# Patient Record
Sex: Male | Born: 1985 | Race: White | Hispanic: No | Marital: Married
Health system: Midwestern US, Community
[De-identification: ages and names within clinical notes are randomized; demographics above are authoritative.]

## PROBLEM LIST (undated history)

## (undated) DIAGNOSIS — B019 Varicella without complication: Secondary | ICD-10-CM

## (undated) DIAGNOSIS — Z0000 Encounter for general adult medical examination without abnormal findings: Secondary | ICD-10-CM

## (undated) DIAGNOSIS — M94262 Chondromalacia, left knee: Secondary | ICD-10-CM

## (undated) DIAGNOSIS — M25561 Pain in right knee: Secondary | ICD-10-CM

## (undated) DIAGNOSIS — Z20822 Contact with and (suspected) exposure to covid-19: Secondary | ICD-10-CM

## (undated) DIAGNOSIS — E559 Vitamin D deficiency, unspecified: Secondary | ICD-10-CM

## (undated) DIAGNOSIS — M25562 Pain in left knee: Secondary | ICD-10-CM

## (undated) HISTORY — DX: Varicella without complication: B01.9

## (undated) HISTORY — PX: KNEE ARTHROSCOPY: SHX127

## (undated) HISTORY — PX: WISDOM TOOTH EXTRACTION: SHX21

---

## 2011-01-24 LAB — CBC WITH AUTO DIFFERENTIAL
Absolute Eos #: 0.14 E9/L (ref 0.05–0.50)
Absolute Lymph #: 2.67 E9/L (ref 1.50–4.00)
Absolute Mono #: 0.7 E9/L (ref 0.10–0.95)
Absolute Neut #: 4.04 E9/L (ref 1.80–7.30)
Basophils Absolute: 0.05 E9/L (ref 0.00–0.20)
Basophils: 1 % (ref 0–2)
Eosinophils %: 2 % (ref 0–6)
Hematocrit: 45.8 % (ref 37.0–54.0)
Hemoglobin: 15.6 g/dL (ref 12.5–16.5)
Lymphocytes: 35 % (ref 20–42)
MCH: 31.5 pg (ref 26.0–35.0)
MCHC: 34.1 % (ref 32.0–34.5)
MCV: 92.2 fL (ref 80.0–99.9)
MPV: 9 fL (ref 7.0–12.0)
Monocytes: 9 % (ref 2–12)
Platelets: 205 E9/L (ref 130–450)
RBC: 4.97 E12/L (ref 3.80–5.80)
RDW: 12.3 fL (ref 11.5–15.0)
Seg Neutrophils: 53 % (ref 43–80)
WBC: 7.6 E9/L (ref 4.5–11.5)

## 2013-03-30 HISTORY — PX: LYMPH NODE BIOPSY: SHX201

## 2013-04-18 ENCOUNTER — Emergency Department: Payer: Self-pay | Admitting: Emergency Medicine

## 2013-04-18 LAB — CBC WITH DIFFERENTIAL/PLATELET
Eosinophil %: 1.4 %
HCT: 45.8 % (ref 40.0–52.0)
HGB: 15.6 g/dL (ref 13.0–18.0)
Lymphocyte #: 2 10*3/uL (ref 1.0–3.6)
MCH: 29.7 pg (ref 26.0–34.0)
MCHC: 34.1 g/dL (ref 32.0–36.0)
Monocyte #: 1.1 x10 3/mm — ABNORMAL HIGH (ref 0.2–1.0)
Neutrophil %: 52.6 %
Platelet: 171 10*3/uL (ref 150–440)
RBC: 5.27 10*6/uL (ref 4.40–5.90)
RDW: 12.3 % (ref 11.5–14.5)
WBC: 6.8 10*3/uL (ref 3.8–10.6)

## 2013-04-18 LAB — RAPID INFLUENZA A&B ANTIGENS

## 2013-04-18 LAB — BASIC METABOLIC PANEL
BUN: 18 mg/dL (ref 7–18)
Chloride: 103 mmol/L (ref 98–107)
Creatinine: 1.17 mg/dL (ref 0.60–1.30)
EGFR (African American): >60
Glucose: 90 mg/dL (ref 65–99)
Osmolality: 275 (ref 275–301)
Potassium: 4.6 mmol/L (ref 3.5–5.1)
Sodium: 137 mmol/L (ref 136–145)

## 2013-05-19 ENCOUNTER — Ambulatory Visit: Payer: Self-pay | Admitting: Otolaryngology

## 2013-05-28 ENCOUNTER — Ambulatory Visit: Payer: Self-pay | Admitting: Otolaryngology

## 2013-06-04 ENCOUNTER — Ambulatory Visit: Payer: Self-pay | Admitting: Otolaryngology

## 2013-06-08 LAB — PATHOLOGY REPORT

## 2013-08-05 ENCOUNTER — Encounter: Payer: Self-pay | Admitting: Adult Health

## 2013-08-05 ENCOUNTER — Encounter (INDEPENDENT_AMBULATORY_CARE_PROVIDER_SITE_OTHER): Payer: Self-pay

## 2013-08-05 ENCOUNTER — Ambulatory Visit (INDEPENDENT_AMBULATORY_CARE_PROVIDER_SITE_OTHER): Payer: BC Managed Care – PPO | Admitting: Adult Health

## 2013-08-05 VITALS — BP 112/78 | HR 72 | Temp 98.3°F | Resp 14 | Ht 68.6 in | Wt 208.0 lb

## 2013-08-05 DIAGNOSIS — Z0000 Encounter for general adult medical examination without abnormal findings: Secondary | ICD-10-CM

## 2013-08-05 DIAGNOSIS — R599 Enlarged lymph nodes, unspecified: Secondary | ICD-10-CM

## 2013-08-05 DIAGNOSIS — R590 Localized enlarged lymph nodes: Secondary | ICD-10-CM

## 2013-08-05 NOTE — Progress Notes (Signed)
Pre visit review using our clinic review tool, if applicable. No additional management support is needed unless otherwise documented below in the visit note. 

## 2013-08-05 NOTE — Progress Notes (Signed)
Patient ID: Kristopher Hebert, male   DOB: July 02, 1985, 28 y.o.   MRN: [id number]    Subjective:    Patient ID: Kristopher Hebert, male    DOB: 05-04-1985, 28 y.o.   MRN: [id number]  HPI  It is a pleasant 28 year old male who presents to clinic to establish care. He moved to this area approximately one year ago from [place]. 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 He reports that in December 2014, he had a lymph node biopsy of the cervical region. Patient reports that surgeon was ruling out lymphoma. Biopsy was negative for malignancy. He still wonders why he had lymphadenopathy. The surgeon had discussed that possibilities of toxoplasmosis versus mononucleosis versus cat scratch fever. However, patient reports that he was never tested for any of these. He would like to have lab work to determine if any antibodies. Still has a small palpable lymph node below the jaw line on the left side.    Past Medical History  Diagnosis Date  . Chicken pox      Past Surgical History  Procedure Laterality Date  . Knee arthroscopy Right     before 2004  . Wisdom tooth extraction      before 2004  . Lymph node biopsy Left 03/2013    Cervical, parotid     Family History  Problem Relation Age of Onset  . Hyperlipidemia Mother   . Hypertension Mother   . Cancer Maternal Grandfather     prostate cancer     History   Social History  . Marital Status: Married    Spouse Name: N/A    Number of Children: 0  . Years of Education: 35   Occupational History  . Nurse Anesthesist      [employer]   Social History Main Topics  . Smoking status: Never Smoker   . Smokeless tobacco: Not on file  . Alcohol Use: Yes     Comment: few drinks a week   . Drug Use: No  . Sexual Activity: Yes   Other Topics Concern  . Not on file   Social History Narrative   Kristopher Hebert grew up in [place]. He attended [employer] for his Bachelor's in Nursing. He then attended the same university and obtained his Masters in  Nursing, Garment/textile technologist. He is currently living in [place] with his wife. He enjoys working out. Spends approximately 1.5 hours at the gym, 6 days a week.      Caffeine - 1-2 cups per day   Exercise - 6 days per week   Diet - lean protein, fruits, vegetables, mindful of carbs    Review of Systems  Constitutional: Negative.   HENT:       Pt still feels slightly swollen lymph node below jaw on the left side.  Eyes: Negative.   Respiratory: Negative.   Cardiovascular: Negative.   Gastrointestinal: Negative.   Endocrine: Negative.   Genitourinary: Negative.   Musculoskeletal: Negative.   Skin: Negative.   Allergic/Immunologic: Negative.   Neurological: Negative.   Hematological: Negative.   Psychiatric/Behavioral: Negative.        Objective:  There were no vitals taken for this visit.   Physical Exam  Constitutional: He is oriented to person, place, and time. He appears well-developed and well-nourished. No distress.  HENT:  Head: Normocephalic and atraumatic.  Right Ear: External ear normal.  Left Ear: External ear normal.  Nose: Nose normal.  Mouth/Throat: Oropharynx is clear and moist.  Eyes: Conjunctivae and EOM  are normal. Pupils are equal, round, and reactive to light.  Neck: Normal range of motion. Neck supple. No tracheal deviation present. No thyromegaly present.  Cardiovascular: Normal rate, regular rhythm, normal heart sounds and intact distal pulses.  Exam reveals no gallop and no friction rub.   No murmur heard. Pulmonary/Chest: Effort normal and breath sounds normal. No respiratory distress. He has no wheezes. He has no rales.  Abdominal: Soft. Bowel sounds are normal. He exhibits no distension and no mass. There is no tenderness. There is no rebound and no guarding.  Musculoskeletal: Normal range of motion. He exhibits no edema and no tenderness.  Lymphadenopathy:    He has no cervical adenopathy.  Neurological: He is alert and oriented to person, place, and  time. He has normal reflexes. No cranial nerve deficit. Coordination normal.  Skin: Skin is warm and dry.  Psychiatric: He has a normal mood and affect. His behavior is normal. Judgment and thought content normal.      Assessment & Plan:   1. Routine general medical examination at a health care facility Normal physical exam. Request records from recent biopsy from Dr. Judie PetitM. Clark. - Comprehensive metabolic panel; Future - Lipid panel; Future  2. Lymphadenopathy of left cervical region Will check antibodies. Pt has had this on his mind since surgeon mentioned it to him. At the time he was feeling quite fatigued. He is feeling better. Going to the gym on a regular basis.  - Cat scratch - Mononucleosis screen - Toxoplasma gondii antibody, IgM

## 2013-08-06 LAB — TOXOPLASMA GONDII ANTIBODY, IGM: TOXOPLASMA ANTIBODY- IGM: 126 [IU]/mL — AB (ref <8.0)

## 2013-08-06 LAB — MONONUCLEOSIS SCREEN: Mono Screen: NEGATIVE

## 2013-08-09 LAB — BARTONELLA ANTIBODY PANEL: B henselae IgM: NEGATIVE

## 2013-08-09 LAB — BARTONELLA ANITBODY PANEL: B HENSELAE IGG: NEGATIVE

## 2013-08-12 ENCOUNTER — Other Ambulatory Visit (INDEPENDENT_AMBULATORY_CARE_PROVIDER_SITE_OTHER): Payer: BC Managed Care – PPO

## 2013-08-12 DIAGNOSIS — Z0000 Encounter for general adult medical examination without abnormal findings: Secondary | ICD-10-CM

## 2013-08-12 LAB — LIPID PANEL
CHOLESTEROL: 191 mg/dL (ref 0–200)
HDL: 50.3 mg/dL (ref >39.00)
LDL Cholesterol: 131 mg/dL — ABNORMAL HIGH (ref 0–99)
Total CHOL/HDL Ratio: 4
Triglycerides: 51 mg/dL (ref 0.0–149.0)
VLDL: 10.2 mg/dL (ref 0.0–40.0)

## 2013-08-12 LAB — COMPREHENSIVE METABOLIC PANEL
ALT: 34 U/L (ref 0–53)
AST: 36 U/L (ref 0–37)
Albumin: 4.1 g/dL (ref 3.5–5.2)
Alkaline Phosphatase: 55 U/L (ref 39–117)
BILIRUBIN TOTAL: 1.1 mg/dL (ref 0.3–1.2)
BUN: 20 mg/dL (ref 6–23)
CO2: 28 meq/L (ref 19–32)
CREATININE: 1.1 mg/dL (ref 0.4–1.5)
Calcium: 9.6 mg/dL (ref 8.4–10.5)
Chloride: 104 mEq/L (ref 96–112)
GFR: 84.45 mL/min (ref >60.00)
Glucose, Bld: 89 mg/dL (ref 70–99)
Potassium: 4 mEq/L (ref 3.5–5.1)
Sodium: 139 mEq/L (ref 135–145)
TOTAL PROTEIN: 7.3 g/dL (ref 6.0–8.3)

## 2013-08-15 ENCOUNTER — Other Ambulatory Visit: Payer: Self-pay | Admitting: Adult Health

## 2013-08-15 ENCOUNTER — Encounter: Payer: Self-pay | Admitting: Adult Health

## 2013-08-15 DIAGNOSIS — B589 Toxoplasmosis, unspecified: Secondary | ICD-10-CM

## 2013-08-17 ENCOUNTER — Encounter: Payer: Self-pay | Admitting: Emergency Medicine

## 2013-08-24 ENCOUNTER — Other Ambulatory Visit: Payer: Self-pay | Admitting: Adult Health

## 2013-08-24 ENCOUNTER — Encounter: Payer: Self-pay | Admitting: Adult Health

## 2013-08-24 DIAGNOSIS — B589 Toxoplasmosis, unspecified: Secondary | ICD-10-CM

## 2013-09-11 ENCOUNTER — Other Ambulatory Visit: Payer: Self-pay

## 2013-09-11 LAB — RAPID HIV-1/2 QL/CONFIRM: HIV-1/2,Rapid Ql: NEGATIVE

## 2014-05-22 IMAGING — CR DG CHEST 2V
1 series · 2 of 2 positions shown · non-contrast
Comparison: No priors.

CLINICAL DATA: Fever.

EXAM:
CHEST  2 VIEW

[Series 1: w chest pa · 0.14mm/px · 2 of 2 slices shown]
[im 1/2]
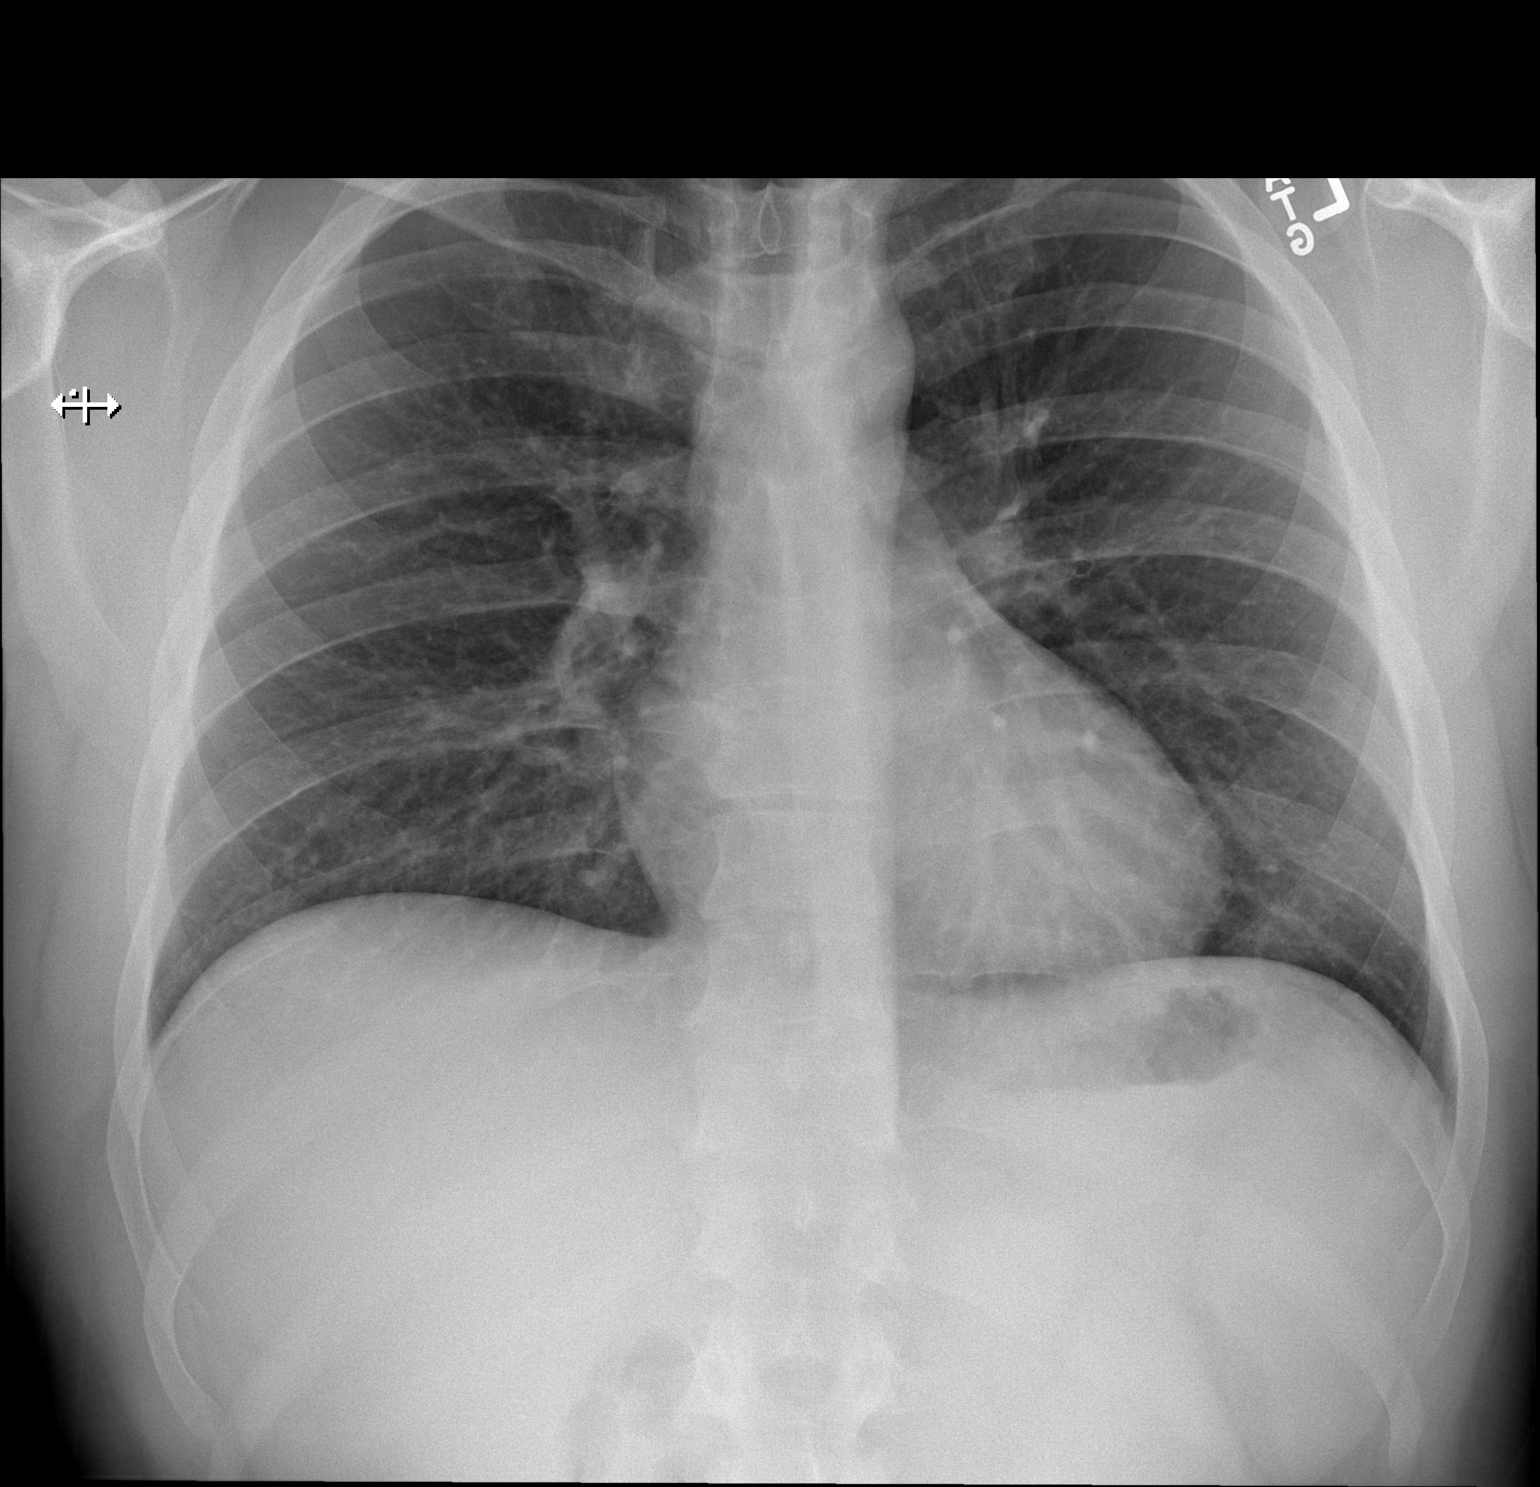
[im 2/2]
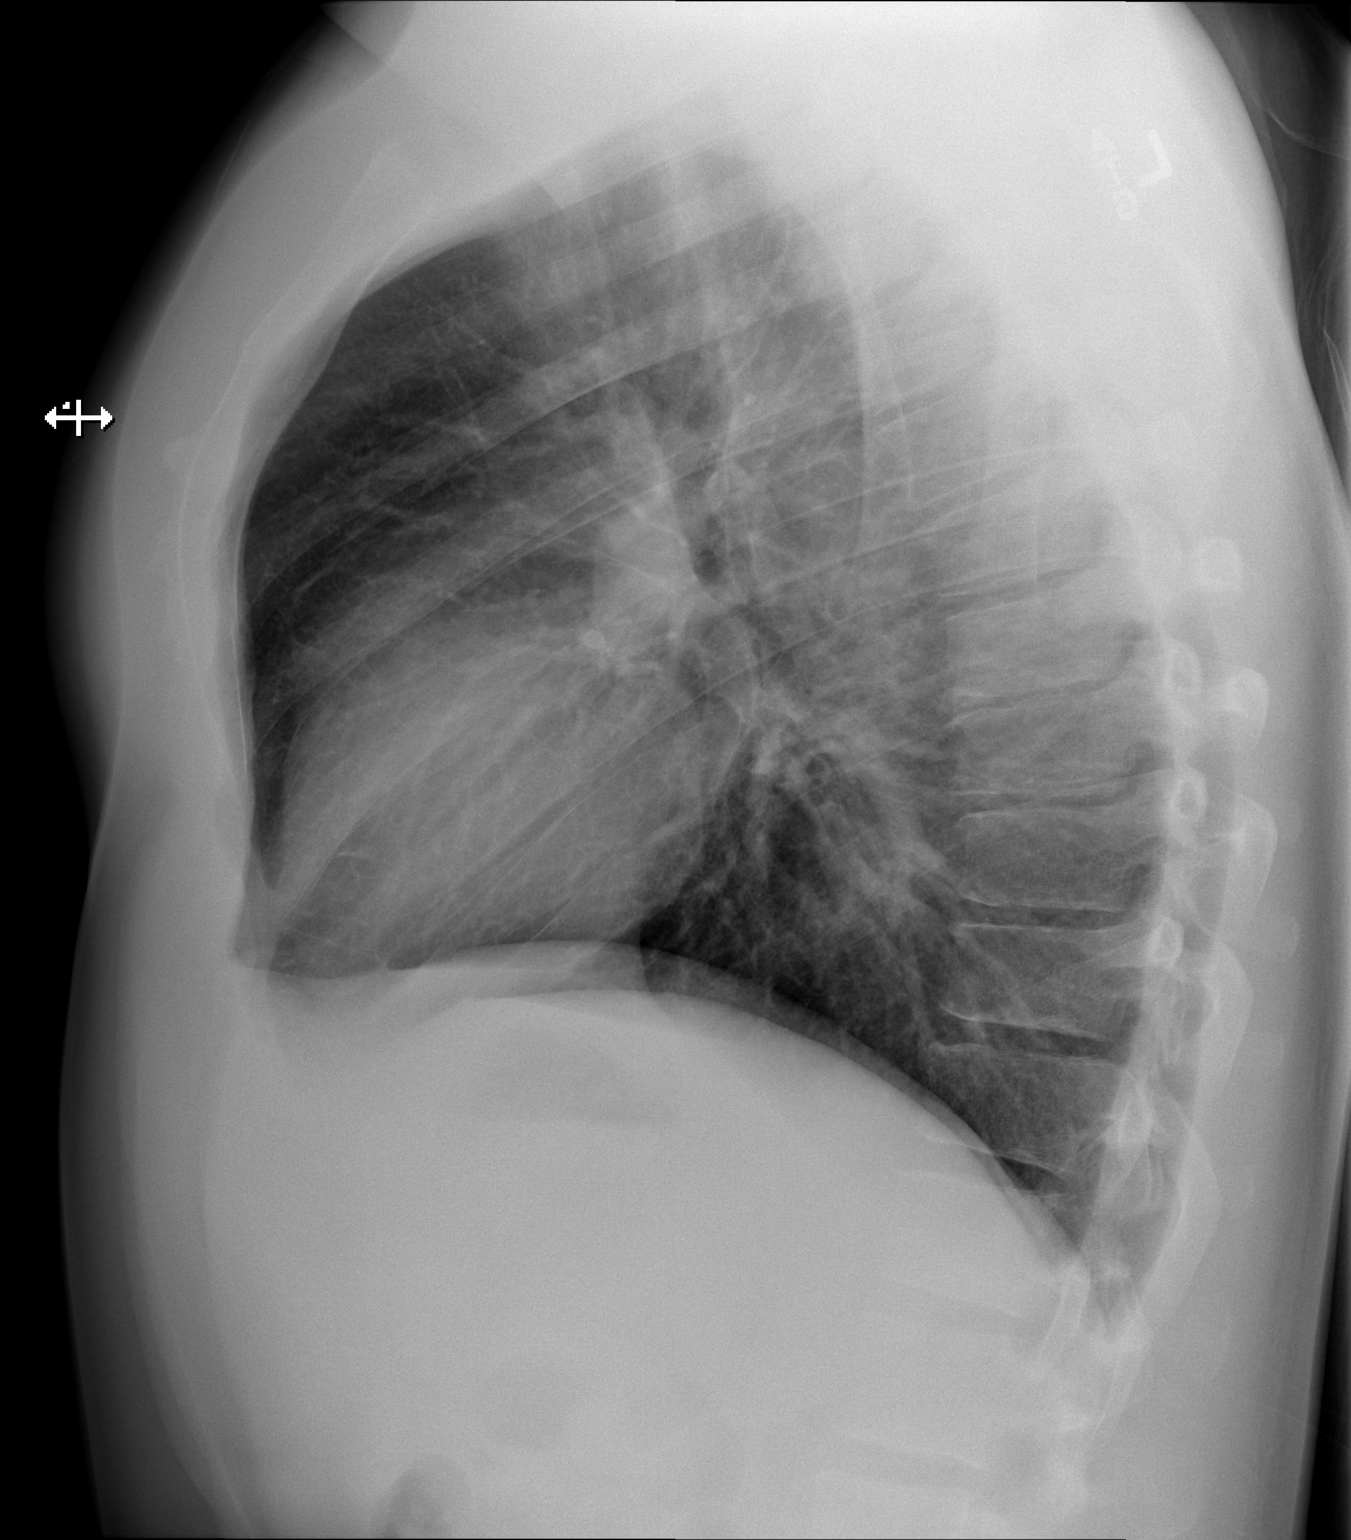

[2 of 2 positions shown; findings below may reference images not displayed]

FINDINGS: Lung volumes are normal. No consolidative airspace disease. No
pleural effusions. No pneumothorax. No pulmonary nodule or mass
noted. Pulmonary vasculature and the cardiomediastinal silhouette
are within normal limits.
IMPRESSION: 1.  No radiographic evidence of acute cardiopulmonary disease.

## 2014-06-22 IMAGING — CT CT NECK WITH CONTRAST
4 of 5 series · 15 of 33 positions shown, 17 images · IV contrast (isovue)
Comparison: None.

CLINICAL DATA: Left neck swelling.

EXAM:
CT NECK WITH CONTRAST
TECHNIQUE: Multidetector CT imaging of the neck was performed using the
standard protocol following the bolus administration of intravenous
contrast.
CONTRAST:  75 mL Isovue 370

[Series 2: axial neck · axial · 0.54mm/px · z∈[+104,+266]mm · 4 of 137 slices shown, 5 images]
[im 28/137  soft-tissue]
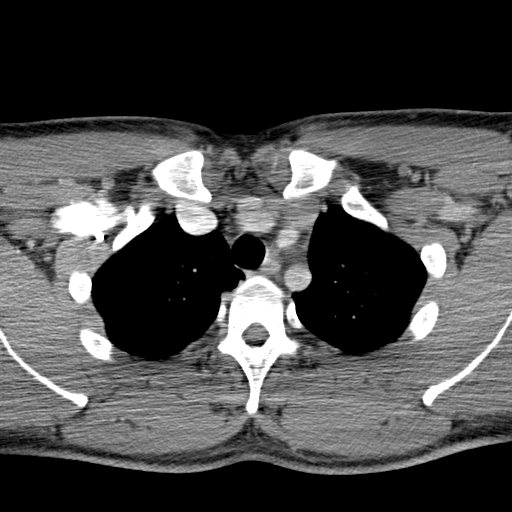
[im 28/137  bone]
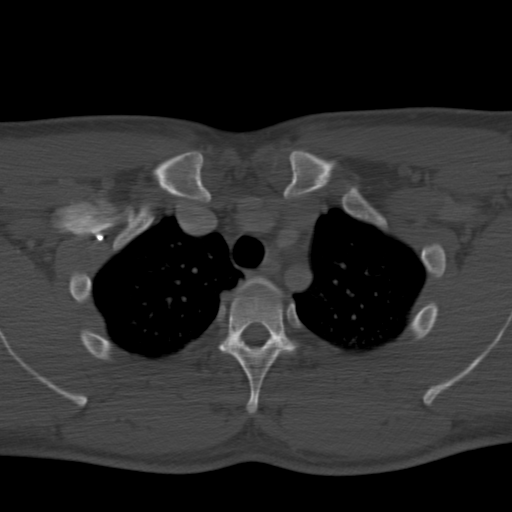
[im 55/137  bone]
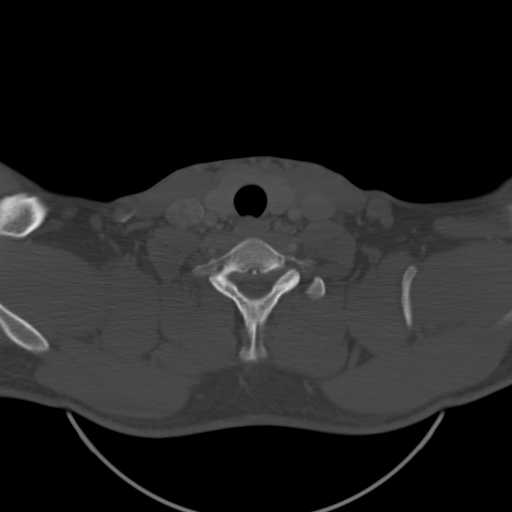
[im 82/137  bone]
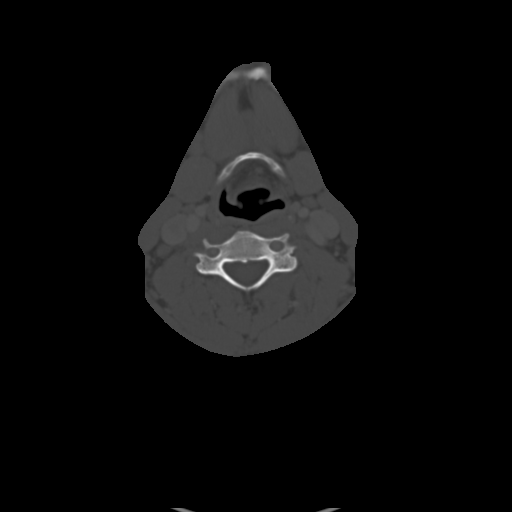
[im 109/137  bone]
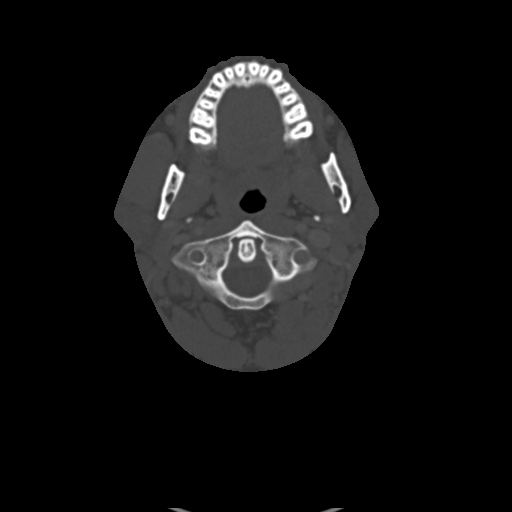

[Series 4: sag neck · sagittal · 0.53mm/px · 5 of 110 slices shown, 6 images]
[im 37/110  bone]
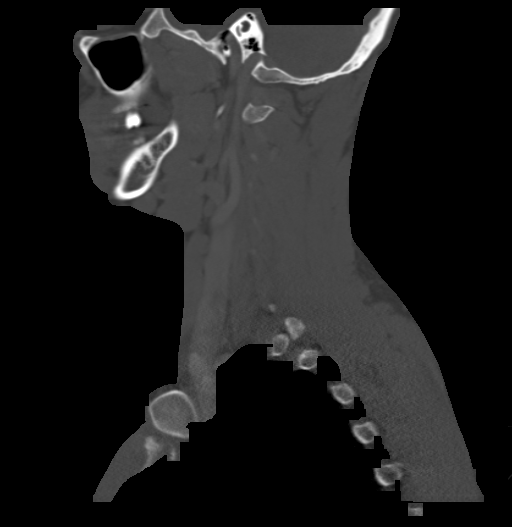
[im 46/110  bone]
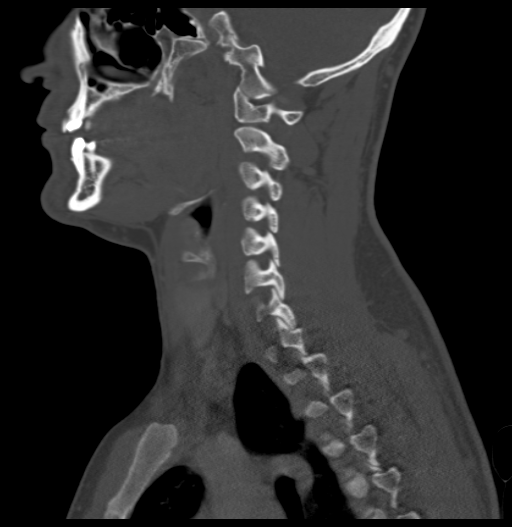
[im 55/110  soft-tissue]
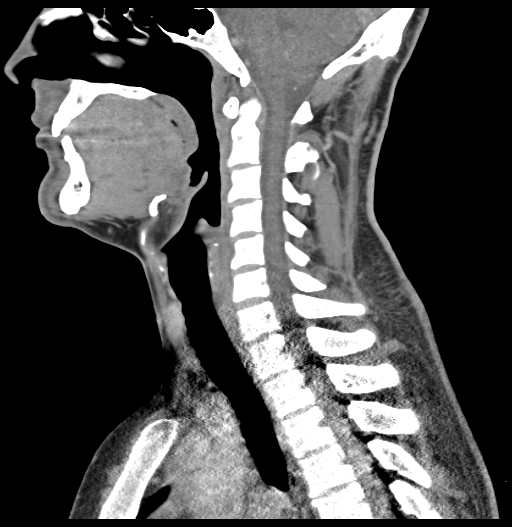
[im 55/110  bone]
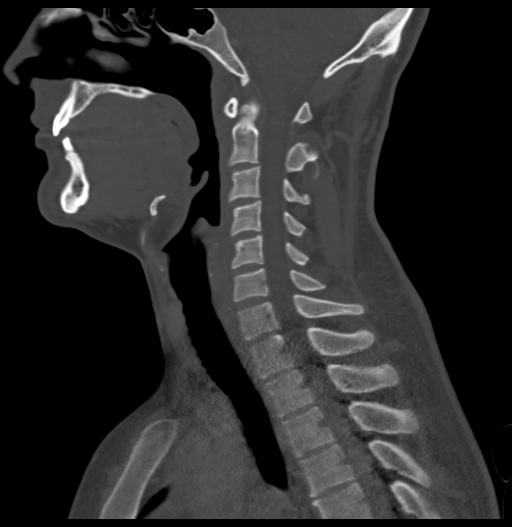
[im 64/110  bone]
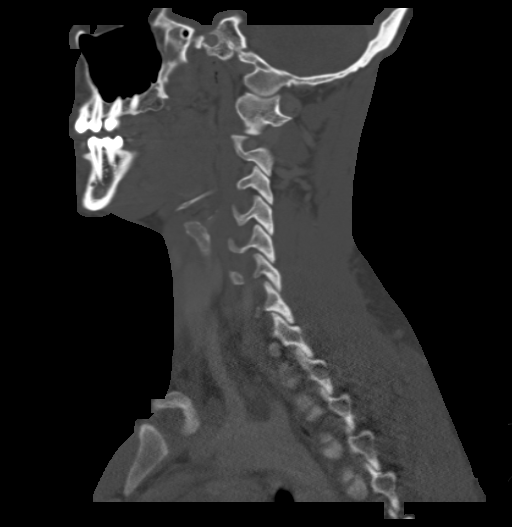
[im 73/110  bone]
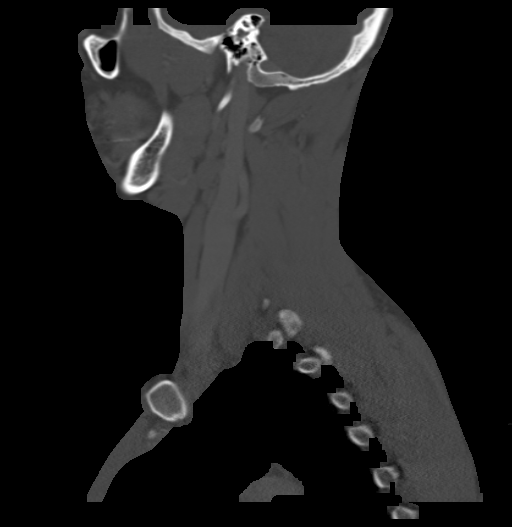

[Series 5: cor neck · coronal · 0.39mm/px · 3 of 126 slices shown]
[im 26/126  bone]
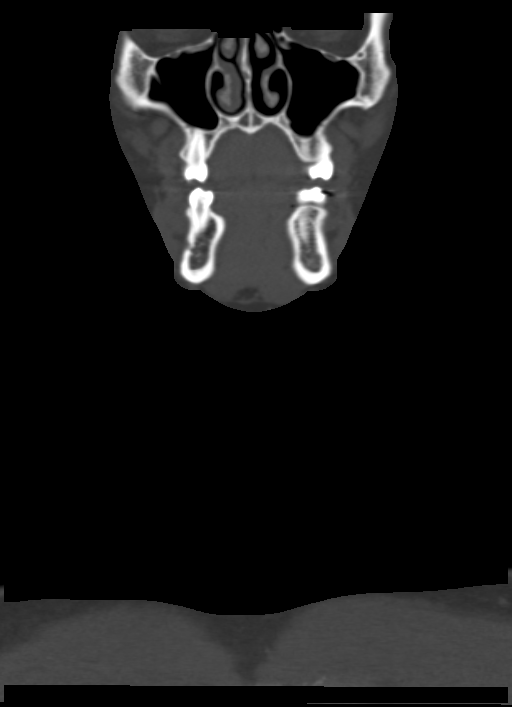
[im 51/126  bone]
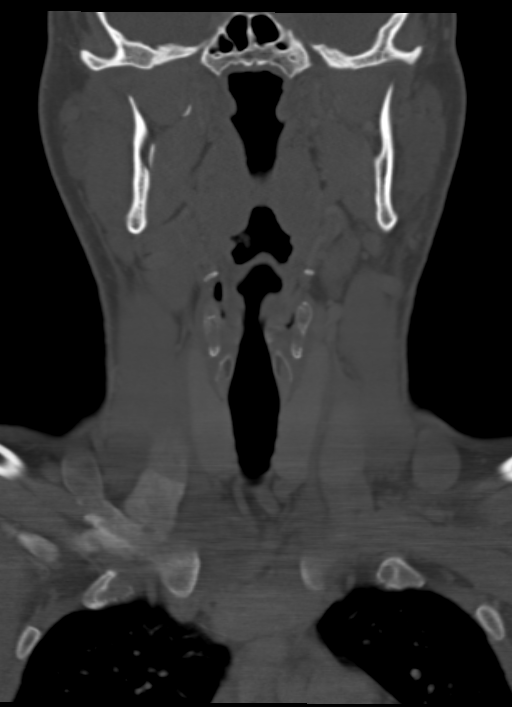
[im 76/126  bone]
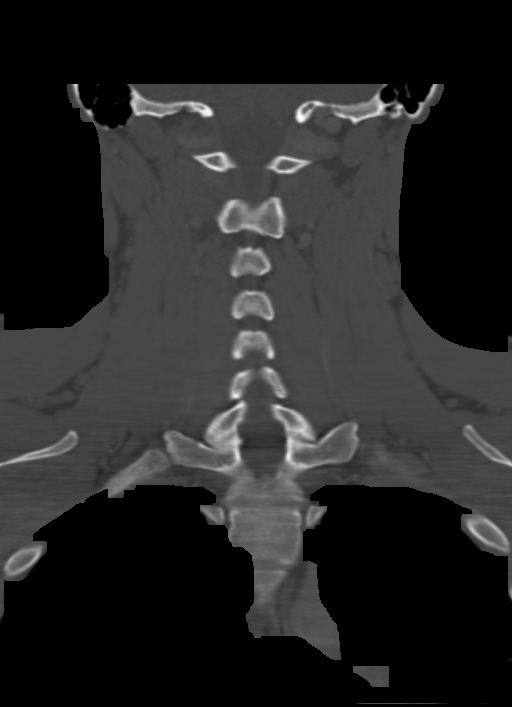

[Series 6: ax oropharynx · axial · 0.44mm/px · z∈[+83,+193]mm · 3 of 141 slices shown]
[im 29/141  bone]
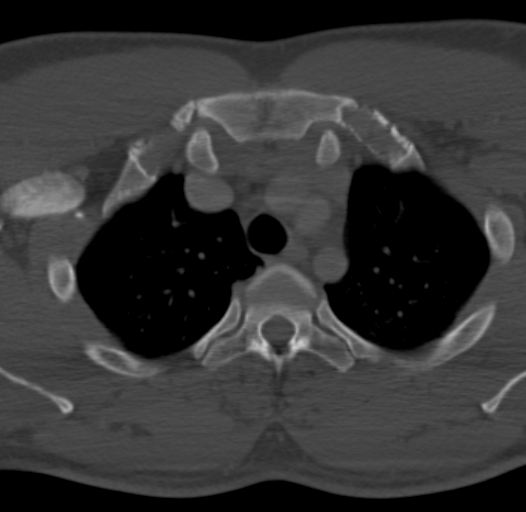
[im 57/141  bone]
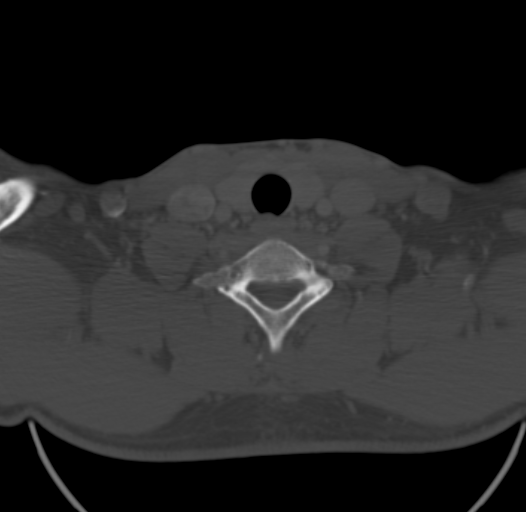
[im 85/141  bone]
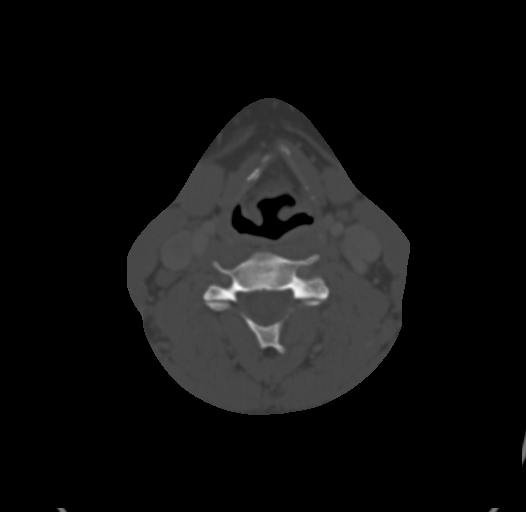

[15 of 33 positions shown; findings below may reference images not displayed]

FINDINGS: A subtle hyperdensity is noted at the tail of the left parotid gland
concerning for a discrete lesion. No other parotid lesions are
evident. The submandibular glands are within normal limits
bilaterally.

There is mild prominence of the palatine tonsils bilaterally with
some calcifications on the right compatible with previous infection.
The adenoid tissue is within normal limits.

No focal mucosal or submucosal lesions are present. The vocal cords
are midline and symmetric. Thyroid is unremarkable.

Prominent jugulodigastric lymph nodes are present bilaterally. A
posterior right level 2 lymph node measures 22 x 15 mm on the
sagittal images. Enlarged ovoid submandibular lymph nodes are
present bilaterally as well. The largest left-sided lymph node is
the jugulodigastric lymph node measuring 23 x 13 mm on the sagittal
reformats.

The lung apices are clear. The bone windows are unremarkable. A soft
tissue nodule over the left trapezius is somewhat irregular and
measures 11 x 14 mm. It is deep to the fascia.
IMPRESSION: 1. The marked area corresponds with a subtle lesion at the base of
the parotid gland. This is most concerning for a benign mixed tumor
or Warthin's tumor. Ultrasound or ultrasound-guided biopsy may be
useful further evaluation.
2. Enlarged lymph nodes bilaterally appear ovoid and likely benign.
The largest node is a posterior right level 2 lymph node.
3. No other salivary gland tumors.
4. Soft tissue nodule over the left trapezius muscle. This is deep
to the fascia and a represent a deep lymph node. Neoplasm is not
excluded. Recommend further evaluation with ultrasound.

## 2014-07-01 IMAGING — US ULTRASOUND CORE BIOPSY
1 series · 13 of 25 positions shown · non-contrast
Comparison: none

CLINICAL DATA: 27-year-old with history of enlarged cervical
lymphadenopathy and palpable area in the region of the left parotid
gland. Recent CT raised concern for a small left parotid lesion.
Request for tissue sampling.

[Series 1: ultrasound core biopsy · 0.05mm/px · 13 of 46 slices shown]
[im 1/46]
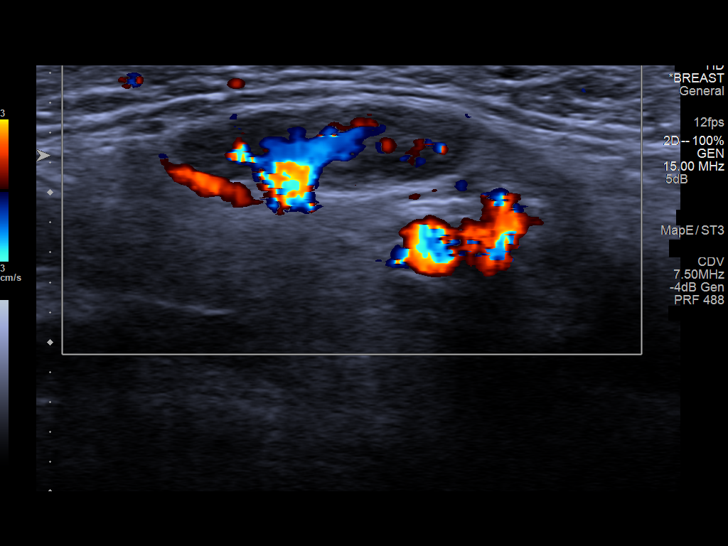
[im 4/46]
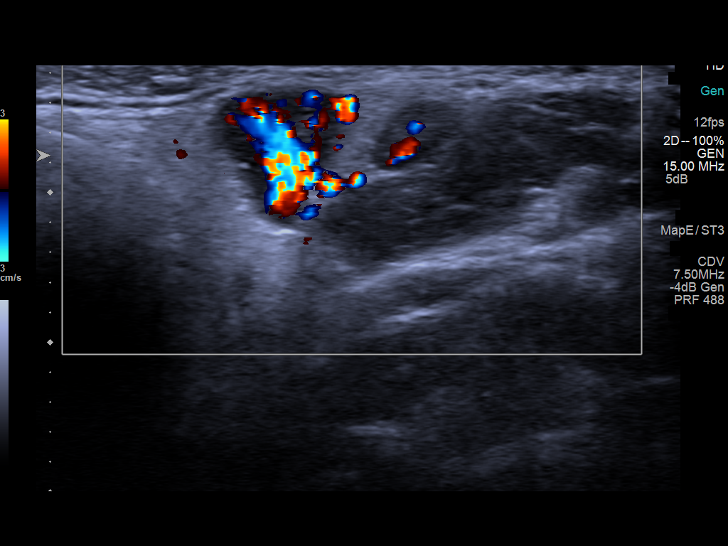
[im 8/46]
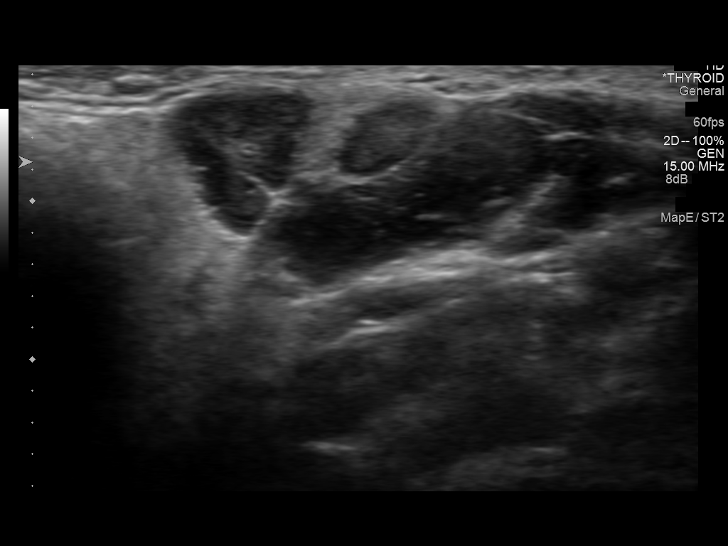
[im 12/46]
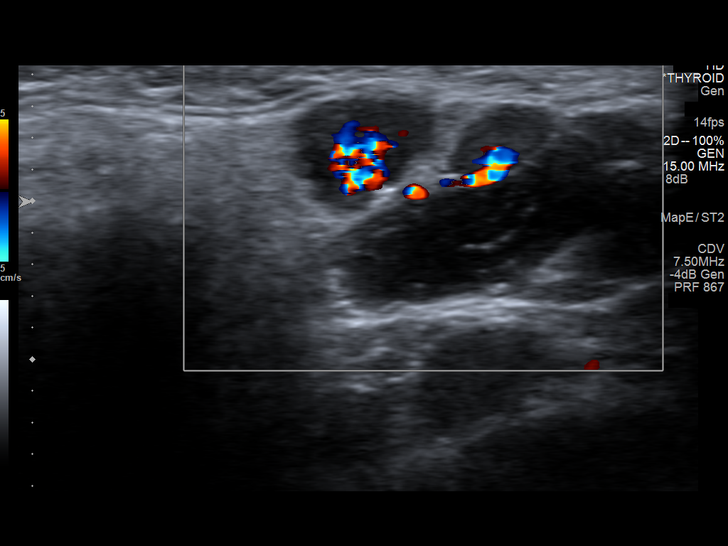
[im 16/46]
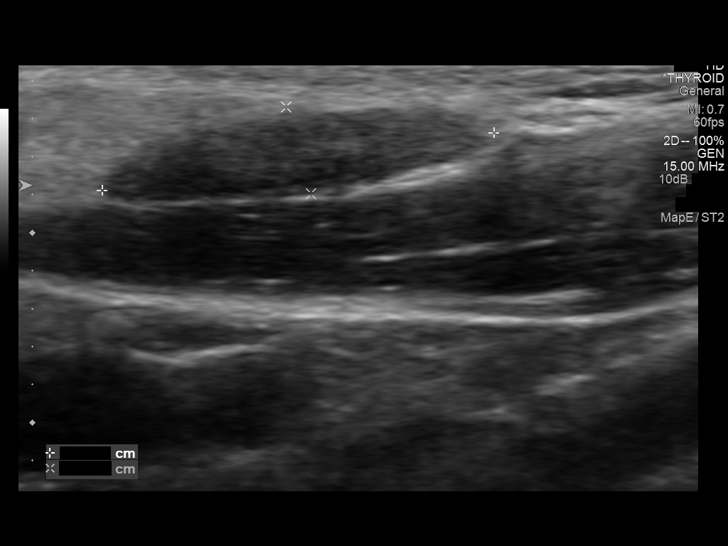
[im 19/46]
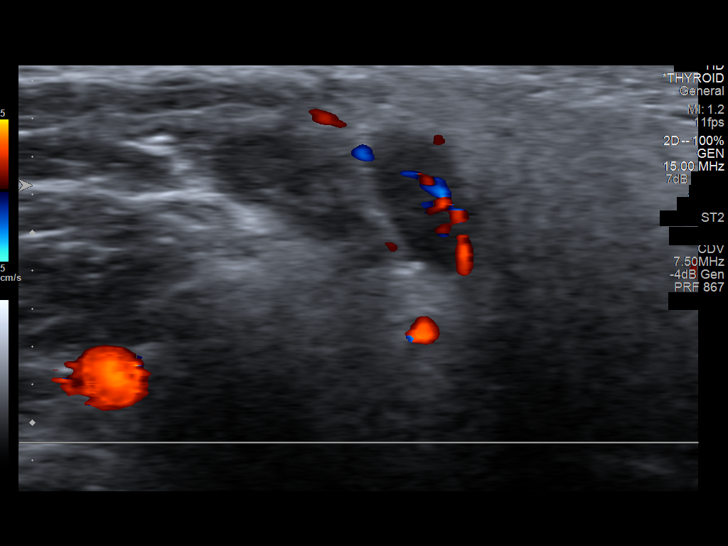
[im 23/46]
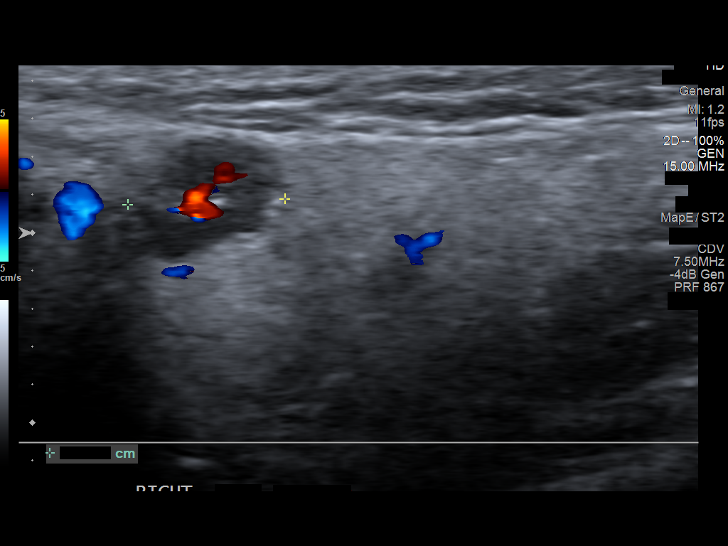
[im 27/46]
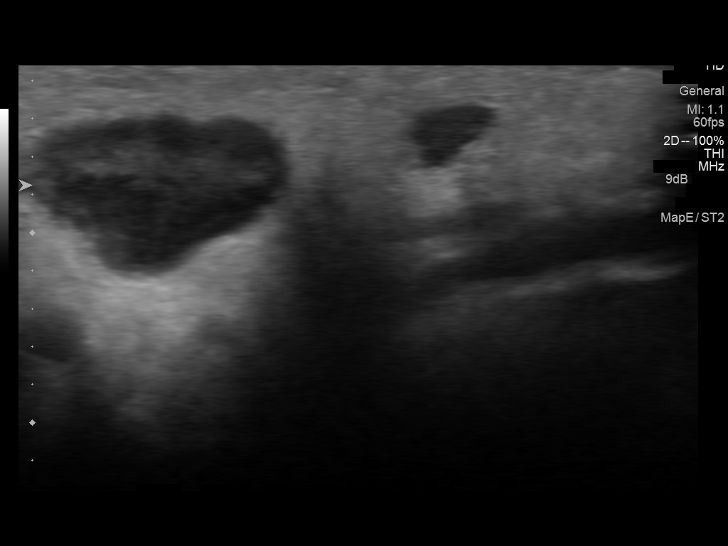
[im 31/46]
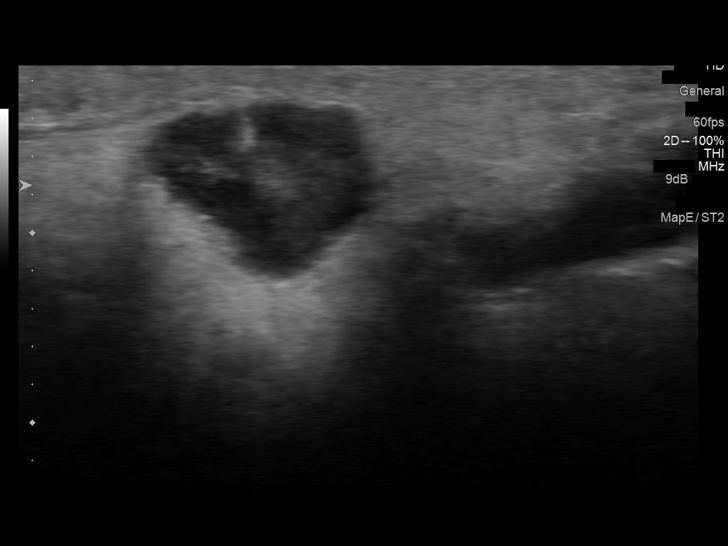
[im 34/46]
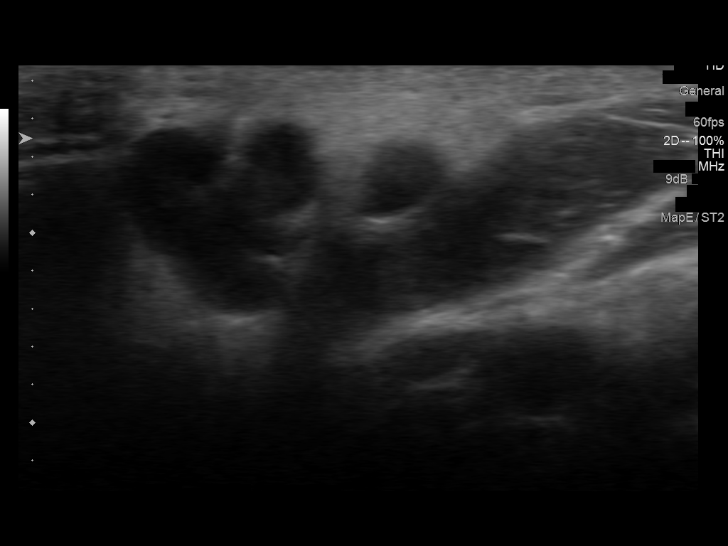
[im 38/46]
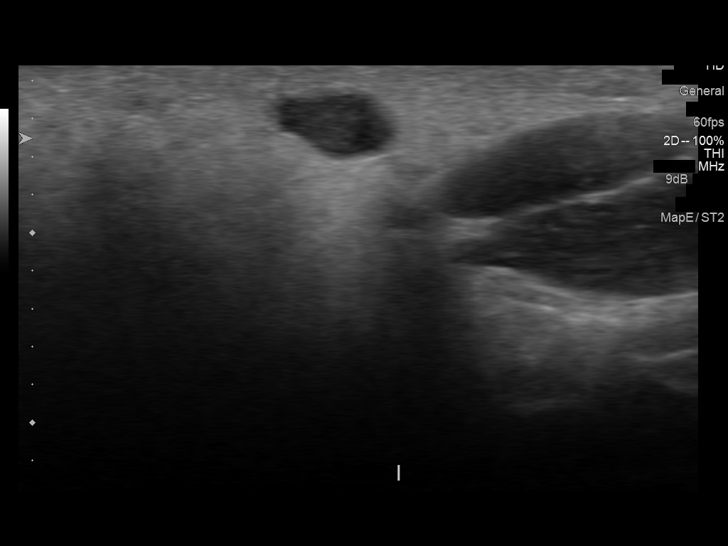
[im 42/46]
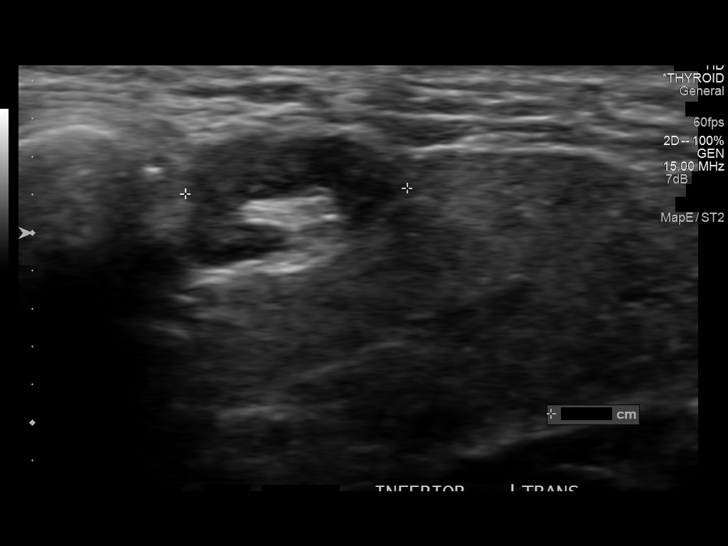
[im 46/46]
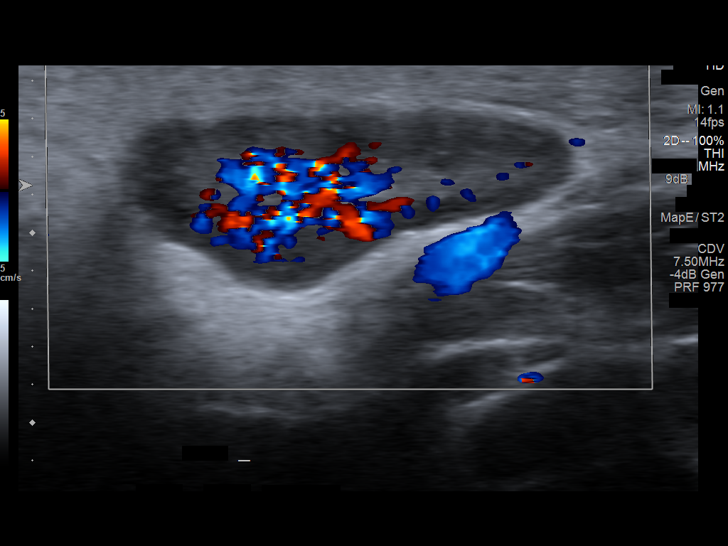

[13 of 25 positions shown; findings below may reference images not displayed]

EXAM:
Ultrasound-guided biopsy of left parotid lesion

MEDICATIONS:
None

ANESTHESIA/SEDATION:
Moderate sedation time: None

FLUOROSCOPY TIME:  None

PROCEDURE:
The procedure was explained to the patient. The risks and benefits
of the procedure were discussed and the patient's questions were
addressed. Informed consent was obtained from the patient. The
parotid glands were evaluated ultrasound bilaterally. The patient
was able to point to the area of concern along the inferior left
parotid gland. There are 2 small nodules in this area which have the
configuration of lymph nodes. There are additional lymph nodes in
the left submandibular region. The largest lesion or lymph node
along the inferior left parotid was targeted for biopsy. The neck
was prepped with chlorhexidine. A sterile field was created. Using
sterile technique and ultrasound guidance, a total 6 fine needle
aspirations were obtained with 25 gauge needles within this left
parotid lesion or lymph node. No immediate complication. A small
bandage was placed over the puncture site.

COMPLICATIONS:
None
FINDINGS: Evidence for left intra parotid lymph nodes. The area of concern
appears to represents prominent lymph node along the inferior aspect
the left parotid gland. The largest measures 0.8 cm in the short
axis. There is an adjacent small lymph node or nodule that measures
0.6 cm in the short axis. There is an additional small hypoechoic
node or lesion in the mid left parotid gland that roughly measures
0.4 cm in the short axis. Images of the right parotid gland also
demonstrated intra parotid lesions which are suggestive for lymph
nodes. Index lesion in the right parotid gland measured 0.8 cm in
the short axis.
IMPRESSION: The area of concern along the inferior left parotid gland was
sampled. A total of 6 fine needle aspirations were obtained with
ultrasound guidance. Ultrasound findings are most compatible with
intra parotid lymph nodes.

## 2014-08-21 NOTE — Op Note (Signed)
PATIENT NAME:  [name], Kristopher Hebert, Kristopher  DATE OF PROCEDURE:  Hebert/08/2013  DATE OF DICTATION: Hebert/08/2013   SURGEON: Zackery BarefootJ. Madison Mina Carlisi, MD  ASSISTANT: None.   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   PROCEDURE: Left superficial parotidectomy with facial nerve dissection and preservation.   DESCRIPTION OF THE PROCEDURE: The patient was identified in the holding area, brought back to the operating room and placed in supine position on the operating room table. After general endotracheal anesthesia had been induced, the patient was turned 90 degrees clockwise from anesthesia and placed in a reverse Trendelenburg position and on a shoulder roll with head tilted to the right. The parotidectomy incision was marked, locally anesthetized, prepped and draped in the usual fashion. A 15-blade was used to make the incision. The deep subcutaneous flap was then elevated and reflected anteriorly. Approximately 2 cm of dissection posteriorly was carried out. The dissection was then carried down to the sternocleidomastoid muscle, taking care to preserve the great auricular nerve. The tissue anterior to the great auricular nerve was reflected anteriorly, and a pretragal plane was dissected down to the tympanomastoid suture. Meticulous dissection was carried out around the tympanomastoid suture, and the main trunk of the facial nerve was not positively identified at that point. Carrying the dissection from a superior to inferior direction, the superior division of the nerve was identified positively and was stimulated with the nerve stimulator. This was followed back to the main trunk of the nerve, which was positively identified slightly superior to the predicted location of the stylomastoid foramen. The inferior  division was identified and traced out, and the superficial lobe of the parotid with the tumor was reflected off of the facial nerve branches. The wound was then copiously irrigated and closed over a 7 mm Jackson-Pratt drain with 4-0 Vicryl and 6-0 nylon suture. The drain stitch was 5-0 silk suture. Bacitracin was placed on the incision. The patient was then returned to anesthesia, allowed to emerge from anesthesia in the operating room and taken to the recovery room in stable condition. There were no complications.   ESTIMATED BLOOD LOSS: 20 mL.   ____________________________ J. Gertie BaronMadison Jayvien Rowlette, MD jmc:lb D: Hebert/08/2013 12:41:27 ET T: Hebert/08/2013 12:53:45 ET JOB#: [id number]  cc: Zackery BarefootJ. Madison Towanna Avery, MD, <Dictator> Wendee CoppJMADISON Kermit Arnette MD ELECTRONICALLY SIGNED 06/22/2013 7:38

## 2014-12-07 ENCOUNTER — Ambulatory Visit
Admit: 2014-12-07 | Discharge: 2014-12-07 | Payer: BLUE CROSS/BLUE SHIELD | Attending: Physician Assistant | Primary: Family Medicine

## 2014-12-07 DIAGNOSIS — R0982 Postnasal drip: Secondary | ICD-10-CM

## 2014-12-07 MED ORDER — FLUTICASONE PROPIONATE 50 MCG/ACT NA SUSP
50 | Freq: Every day | NASAL | 0 refills | 60.00000 days | Status: DC
Start: 2014-12-07 — End: 2017-09-11

## 2014-12-07 MED ORDER — CEFDINIR 300 MG PO CAPS
300 MG | ORAL_CAPSULE | Freq: Two times a day (BID) | ORAL | 0 refills | Status: AC
Start: 2014-12-07 — End: 2014-12-17

## 2014-12-07 NOTE — Progress Notes (Signed)
Chief Complaint:   Facial Pain - pressure,congestion and Cough - non-productive      History of Present Illness   Source of history provided by:  patient.      Kyle Noble is a 29 y.o. old male with a past medical history of: No past medical history on file. presents to the ready care for nasal congestion which began 14 days ago.  States there is facial pressure, discolored nasal mucous, a dry cough, ear pressure, and a scratchy throat.  He has been taking Mucinex without relief. Denies any CP, SOB, abdominal pain, fever, chills, neck stiffness, rash, or lethargy.      ROS   Review of Systems:    Pertinent positives and negatives are stated within HPI, all other systems reviewed and are negative.      Past Surgical History:  has no past surgical history on file.  Social History:  reports that he has never smoked. He does not have any smokeless tobacco history on file.  Family History: family history is not on file.   Allergies: Review of patient's allergies indicates no known allergies.    Physical Exam         VS:    Visit Vitals   ??? BP 118/66 (Site: Right Arm, Position: Sitting, Cuff Size: Large Adult)   ??? Pulse 76   ??? Temp 98.2 ??F (36.8 ??C) (Oral)   ??? Resp 18   ??? Ht 5\' 8"  (1.727 m)   ??? Wt 215 lb (97.5 kg)   ??? SpO2 98%   ??? BMI 32.69 kg/m2      Oxygen Saturation Interpretation: Normal.    Constitutional:  Alert, development consistent with age.  Ears:  External Ears: Bilateral pinna normal. Left TM retracted, mild erythema and loss of and marks no perforation bilaterally.  Canals normal bilaterally.  Left serous fluid noted.   Nose:   There is mild bilateral injection to middle turbinates bilaterally.   Mouth: Normal to inspection.  Throat: Mild posterior pharyngeal erythema with mild post nasal drip present.  No exudate.    Neck:  Supple. There is no anterior cervical adenopathy.  Lungs:   Breath sounds: Non-labored, equal, and without adventitious sounds. No wheezing, rales, or rhonchi.  Heart:  Regular rate  and rhythm, normal heart sounds, without pathological murmurs, ectopy, gallops, or rubs.  Skin:  Normal turgor.  Warm, dry, without visible rash.  Neurological:  Alert and oriented.  Motor functions intact.  Responds to verbal commands.     Lab / Imaging Results   (All laboratory and radiology results have been personally reviewed by myself)  Labs:  No results found for this visit on 12/07/14.      Assessment / Plan     Impression(s):  1. PND (post-nasal drip)    2. Sinus congestion    3. Left otitis media, unspecified chronicity, unspecified otitis media type      Disposition:  Disposition: Discharge to home    New Prescriptions    CEFDINIR (OMNICEF) 300 MG CAPSULE    Take 1 capsule by mouth 2 times daily for 10 days    FLUTICASONE (FLONASE) 50 MCG/ACT NASAL SPRAY    1 spray by Nasal route daily     Follow up with PCP in 7-10 days.  ER if changes or worse.  Patient advised to take all medications as prescribed.

## 2015-03-29 LAB — HEPATITIS C ANTIBODY: Hep C Ab Interp: NONREACTIVE

## 2015-03-29 LAB — HIV SCREEN: HIV-1/HIV-2 Ab: NONREACTIVE

## 2015-03-29 LAB — HEPATITIS B SURFACE ANTIGEN: Hep B S Ag Interp: NONREACTIVE

## 2015-03-29 LAB — HEPATITIS B SURFACE ANTIBODY: Hep B S Ab: REACTIVE

## 2015-05-13 LAB — LUTEINIZING HORMONE: LH: 4.8 m[IU]/mL

## 2015-05-13 LAB — PROLACTIN: Prolactin: 11.89 ng/mL

## 2015-05-13 LAB — FOLLICLE STIMULATING HORMONE: FSH: 6.6 m[IU]/mL

## 2015-05-13 LAB — ESTRADIOL: Estradiol: 29.4 pg/mL

## 2015-05-14 LAB — TESTOSTERONE FREE AND TOTAL MALE
Sex Hormone Binding: 39 nmol/L (ref 11–80)
Testosterone % Free: 1.7 % (ref 1.6–2.9)
Testosterone Free, Calc: 74 pg/mL (ref 47–244)
Total Testosterone: 437 ng/dL (ref 300–1080)

## 2015-05-26 ENCOUNTER — Ambulatory Visit
Admit: 2015-05-26 | Discharge: 2015-05-30 | Payer: PRIVATE HEALTH INSURANCE | Attending: Physician Assistant | Primary: Family Medicine

## 2015-05-26 DIAGNOSIS — B029 Zoster without complications: Secondary | ICD-10-CM

## 2015-05-26 NOTE — Progress Notes (Signed)
Chief Complaint:   Rash (left arm , possible shingles)      History of Present Illness   Source of history provided by:  patient.      Kyle Noble is a 30 y.o. old male who has a past medical history of: History reviewed. No pertinent past medical history. presents to the ready care for complaints of noticing a rash to his left arm today and having some pain to his posterior neck and upper posterior shoulder area for the last few days. He reports that initially he thought he pulled something, but then noticed the rash to his left arm and wanted to get checked for Shingles. 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 Prior to the rash appearing, he did have some muscular pain at the same area.  Since onset the symptoms have worsened.  Pt has not had similar symptoms in the past.  He is unsure of the area is warm to touch, moderately painful , not really itchy, and has no noted streaking.  He denies any fever, chills, HA, recent illness, neck stiffness, vomiting, or lethargy.       ROS    Pertinent positives and negatives are stated within HPI, all other systems reviewed and are negative.  .    Past Surgical History:  has no past surgical history on file.  Social History:  reports that he has never smoked. He does not have any smokeless tobacco history on file.  Family History: family history is not on file.   Allergies: Review of patient's allergies indicates no known allergies.    Physical Exam         VS:    Visit Vitals   . BP 126/88   . Pulse 88   . Temp 99 F (37.2 C) (Oral)   . Resp 17   . Ht  (1.727 m)   . Wt 215 lb (97.5 kg)   . SpO2 98%   . BMI 32.69 kg/m2      Oxygen Saturation Interpretation: Normal.    Constitutional:  Alert, development consistent with age.  HEENT:  NC/NT.  Airway patent.  Eyes:  PERRL, EOMI, no discharge.     Ears:  TMs without perforation, injection, or bulging.  External canals clear without exudate.  Mouth:  Mucous membranes moist without lesions, tongue and gums  normal.  Throat:  Pharynx without injection, exudate, or tonsillar hypertrophy.  Airway patient.  Neck:  Supple.  No lymphadenopathy.  Lungs:  Clear to auscultation and breath sounds equal.  Heart:  Regular rate and rhythm.  Skin:  Normal turgor and appropriately dry to touch.  Rash noted to left posterior upper back area as well as to left antecubital area with moderate erythema and vesicles in clusters following a dermatomal pattern.  Moderate TTP over same area and slightly warm to touch.  No excoriations, macules, papules, or bullae noted. No open or active  drainage noted.      Neurological:  Orientation age-appropriate unless noted elseware.  Motor functions intact.      Assessment / Plan     Impression(s):  1. Herpes zoster without complication      Disposition:  Disposition: Discharge to home  Patient advised of rash to left posterior neck and upper back which is classic for shingles and suspect that with recent pain, will most likely break out in further rash. Recommend Rx for Zovirax  5 times daily for 7 days. Will give small RX for Norco for  pain, try to reserve at night for sleep and not when working or driving.      Pt advised to f/u with PCP in 2-3 days for recheck and further management ,sooner to  ER if changes or worse.  Pt advised to take all medications as directed. Note for work given.

## 2015-05-27 MED ORDER — ACYCLOVIR 400 MG PO TABS
400 MG | ORAL_TABLET | Freq: Every day | ORAL | 0 refills | Status: AC
Start: 2015-05-27 — End: 2015-06-02

## 2015-05-27 MED ORDER — HYDROCODONE-ACETAMINOPHEN 5-325 MG PO TABS
5-325 MG | ORAL_TABLET | Freq: Every evening | ORAL | 0 refills | Status: AC | PRN
Start: 2015-05-27 — End: 2015-06-02

## 2015-09-21 LAB — ESTRADIOL: Estradiol: 41.6 pg/mL

## 2015-09-21 LAB — LUTEINIZING HORMONE: LH: 8.6 m[IU]/mL

## 2015-09-21 LAB — FOLLICLE STIMULATING HORMONE: FSH: 11.7 m[IU]/mL

## 2015-09-22 LAB — TESTOSTERONE FREE AND TOTAL MALE
Sex Hormone Binding: 44 nmol/L (ref 11–80)
Testosterone % Free: 1.8 % (ref 1.6–2.9)
Testosterone Free, Calc: 150 pg/mL (ref 47–244)
Total Testosterone: 852 ng/dL (ref 300–1080)

## 2016-04-11 ENCOUNTER — Ambulatory Visit
Admit: 2016-04-11 | Discharge: 2016-04-11 | Payer: PRIVATE HEALTH INSURANCE | Attending: Orthopaedic Surgery | Primary: Family Medicine

## 2016-04-11 DIAGNOSIS — M25562 Pain in left knee: Secondary | ICD-10-CM

## 2016-04-11 NOTE — Progress Notes (Signed)
New Knee Patient     Referring Provider:   [employer] Employee    CHIEF COMPLAINT:   Chief Complaint   Patient presents with   ??? Knee Pain     (B) knee pain, 6 months (R) knee, 2 months (L) knee; he states a feeling of hyperextending, knee pops   ??? Other     he states has to take stairs slowly and unable to be active        HPI:    Kyle Noble is a 30 y.o. year old male who is seen today  for evaluation of bilateral knee pain, right worse than left.  He has had issues with his right knee stemming back to an early age when he had Osgood-Schlatter's disease and was placed in a knee immobilizer.  He ultimately required arthroscopic surgery after this due to presumed stiffness.  He states he may have had some portion of his meniscus removed.  He did okay from this but recently was hunting and walking through the woods on uneven ground, and going in and out of trees, afterwards noticed significant swelling of his right knee.  He also has some mechanical symptoms.  He has occasional similar feelings in the left knee.  He has not had any treatment. The patient is working. 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     PAST MEDICAL HISTORY  Past Medical History:   Diagnosis Date   ??? Chronic knee pain    ??? Osgood-Schlatter's disease        PAST SURGICAL HISTORY  Past Surgical History:   Procedure Laterality Date   ??? KNEE ARTHROSCOPY Right    ??? NECK SURGERY     ??? TESTICLE SURGERY Left          FAMILY HISTORY   No family history on file.    SOCIAL HISTORY  Social History     Social History   ??? Marital status: Single     Spouse name: N/A   ??? Number of children: N/A   ??? Years of education: N/A     Occupational History   ??? Not on file.     Social History Main Topics   ??? Smoking status: Never Smoker   ??? Smokeless tobacco: Never Used   ??? Alcohol use Yes      Comment: occas   ??? Drug use: No   ??? Sexual activity: Not on file     Other Topics Concern   ??? Not on file     Social History Narrative   ??? No narrative on file     Social  History     Occupational History   ??? Not on file.     Social History Main Topics   ??? Smoking status: Never Smoker   ??? Smokeless tobacco: Never Used   ??? Alcohol use Yes      Comment: occas   ??? Drug use: No   ??? Sexual activity: Not on file       CURRENT MEDICATIONS     Current Outpatient Prescriptions:   ???  GuaiFENesin (MUCINEX PO), Take by mouth, Disp: , Rfl:   ???  Naproxen Sodium (ALEVE) 220 MG CAPS, Take by mouth, Disp: , Rfl:   ???  fluticasone (FLONASE) 50 MCG/ACT nasal spray, 1 spray by Nasal route daily, Disp: 1 Bottle, Rfl: 0    ALLERGIES  No Known Allergies    Controlled Substances Monitoring:          REVIEW  OF SYSTEMS:     Constitutional:  Negative for weight loss, fevers, chills, fatigue  Cardiovascular: Negative for chest pain, palpitations  Pulmonary: Negative for shortness of breath, labored breathing, cough  GI: negative for abdominal pain, nausea, vomitting   MSK: per HPI  Skin: negative for rash, open wounds    All other systems reviewed and are negative         PHYSICAL EXAM     Vitals:    04/11/16 0819   BP: 138/89   Site: Left Arm   Position: Sitting   Pulse: 85   Temp: 98 ??F (36.7 ??C)   TempSrc: Oral   Weight: 185 lb (83.9 kg)   Height: 5\' 8"  (1.727 m)        Height: 5\' 8"  (1.727 m)  Weight: 185 lb  BMI:  Body mass index is 28.13 kg/m??.    General: The patient is alert and oriented x 3, appears to be stated age and in no distress.   HEENT: head is normocephalic, atraumatic.  EOMI.   Neck: supple, trachea midline, no thyromegaly   Cardiovascular: peripheral pulses palpable.  Normal Capillary refill   Respiratory: breathing unlabored, chest expansion symmetric   Skin: no rash, no open wounds, no erythema  Psych: normal affect; mood stable  Neurologic: gait normal, sensation grossly intact in extremities  MSK:        Lower Extremity:   Ipsilateral hip exam shows normal range of motion without pain with impingement testing.      On exam of the right lower extremity, there is medial joint line  tenderness.  Range of motion is 0-130??.  Patella tracked midline.  There is normal patellar range of motion.  Lachman is stable.  Posterior drawer stable.  The knee is stable to varus and valgus stress throughout range of motion.            IMAGING:    XR: 4 views of the bilateral knees were obtained today including AP, tunnel, lateral, and sunrise views.  These show maintained joint spaces throughout.  On the right knee, there is possible osteochondral defect of the medial femoral condyle seen on flexion views.    Impression: No acute abnormality of bilateral knees, with concern for osteochondral defect of medial femoral condyle        ASSESSMENT  Right knee pain, mechanical symptoms, and concern for osteochondral defect of medial femoral condyle    PLAN  We discussed his knee today as well as treatment options.  Given his young age, mechanical symptoms, intermittent swelling, and x-rays concerning for a possible osteochondral defect of the medial femoral condyle, I recommended we obtain an MRI to assess for this.  We will see him back after the MRI is complete to discuss treatment options.      Dyanne IhaJeffrey Ko Bardon, MD  Orthopaedic Surgery   04/11/16  8:26 AM

## 2016-04-11 NOTE — Addendum Note (Signed)
Addended byDyanne Iha: Keirstin Musil on: 05/08/2016 12:37 PM     Modules accepted: Orders

## 2016-04-26 NOTE — Progress Notes (Signed)
Test not completed, yet.

## 2016-05-07 ENCOUNTER — Telehealth

## 2016-05-07 NOTE — Telephone Encounter (Signed)
Please change to L knee.

## 2016-05-18 ENCOUNTER — Inpatient Hospital Stay: Admit: 2016-05-18 | Attending: Orthopaedic Surgery | Primary: Family Medicine

## 2016-05-18 DIAGNOSIS — M25562 Pain in left knee: Secondary | ICD-10-CM

## 2016-05-18 DIAGNOSIS — M25561 Pain in right knee: Secondary | ICD-10-CM

## 2016-05-23 NOTE — Progress Notes (Signed)
Resulted in chart. See progress note.

## 2016-06-30 LAB — HEMOGLOBIN A1C: Hemoglobin A1C: 4.9 %

## 2016-06-30 LAB — LIPID PANEL
Chol/HDL Ratio: 2.9
Cholesterol, Total: 162 mg/dL
HDL: 55 mg/dL (ref 35–70)
LDL Calculated: 95 mg/dL (ref 0–160)
Triglycerides: 40 mg/dL

## 2016-06-30 LAB — CREATININE: Creatinine: 1 mg/dL

## 2016-06-30 LAB — POTASSIUM: Potassium (K+): 4.7

## 2017-09-11 ENCOUNTER — Ambulatory Visit
Admit: 2017-09-11 | Discharge: 2017-09-11 | Payer: BLUE CROSS/BLUE SHIELD | Attending: Internal Medicine | Primary: Family Medicine

## 2017-09-11 ENCOUNTER — Inpatient Hospital Stay: Payer: BLUE CROSS/BLUE SHIELD | Primary: Family Medicine

## 2017-09-11 DIAGNOSIS — Z0001 Encounter for general adult medical examination with abnormal findings: Secondary | ICD-10-CM

## 2017-09-11 LAB — CBC WITH AUTO DIFFERENTIAL
Basophils %: 0.9 % (ref 0.0–2.0)
Basophils Absolute: 0.05 E9/L (ref 0.00–0.20)
Eosinophils %: 3.6 % (ref 0.0–6.0)
Eosinophils Absolute: 0.2 E9/L (ref 0.05–0.50)
Hematocrit: 45.6 % (ref 37.0–54.0)
Hemoglobin: 15.4 g/dL (ref 12.5–16.5)
Immature Granulocytes #: 0 E9/L
Immature Granulocytes %: 0 % (ref 0.0–5.0)
Lymphocytes %: 42.2 % — ABNORMAL HIGH (ref 20.0–42.0)
Lymphocytes Absolute: 2.34 E9/L (ref 1.50–4.00)
MCH: 30.1 pg (ref 26.0–35.0)
MCHC: 33.8 % (ref 32.0–34.5)
MCV: 89.2 fL (ref 80.0–99.9)
MPV: 11.3 fL (ref 7.0–12.0)
Monocytes %: 10.1 % (ref 2.0–12.0)
Monocytes Absolute: 0.56 E9/L (ref 0.10–0.95)
Neutrophils %: 43.2 % (ref 43.0–80.0)
Neutrophils Absolute: 2.4 E9/L (ref 1.80–7.30)
Platelets: 234 E9/L (ref 130–450)
RBC: 5.11 E12/L (ref 3.80–5.80)
RDW: 12.5 fL (ref 11.5–15.0)
WBC: 5.6 E9/L (ref 4.5–11.5)

## 2017-09-11 LAB — COMPREHENSIVE METABOLIC PANEL
ALT: 25 U/L (ref 0–40)
AST: 23 U/L (ref 0–39)
Albumin: 4.4 g/dL (ref 3.5–5.2)
Alkaline Phosphatase: 61 U/L (ref 40–129)
Anion Gap: 17 mmol/L — ABNORMAL HIGH (ref 7–16)
BUN: 24 mg/dL — ABNORMAL HIGH (ref 6–20)
CO2: 24 mmol/L (ref 22–29)
Calcium: 10.2 mg/dL (ref 8.6–10.2)
Chloride: 105 mmol/L (ref 98–107)
Creatinine: 1 mg/dL (ref 0.7–1.2)
GFR African American: >60
GFR Non-African American: >60 mL/min/{1.73_m2} (ref >=60)
Glucose: 93 mg/dL (ref 74–99)
Potassium: 5 mmol/L (ref 3.5–5.0)
Sodium: 146 mmol/L (ref 132–146)
Total Bilirubin: 0.5 mg/dL (ref 0.0–1.2)
Total Protein: 7.5 g/dL (ref 6.4–8.3)

## 2017-09-11 LAB — LIPID PANEL
Cholesterol, Total: 231 mg/dL — ABNORMAL HIGH (ref 0–199)
HDL: 59 mg/dL (ref >40)
LDL Calculated: 156 mg/dL — ABNORMAL HIGH (ref 0–99)
Triglycerides: 82 mg/dL (ref 0–149)
VLDL Cholesterol Calculated: 16 mg/dL

## 2017-09-11 LAB — TSH: TSH: 1.36 u[IU]/mL (ref 0.270–4.200)

## 2017-09-11 NOTE — Progress Notes (Signed)
[facility] [place]  Phone: [phone]   Fax: [phone]    Patient:  Kyle Noble 32 y.o. male                                 Date of Service: 09/11/17                            Chiefcomplaint:   Chief Complaint   Patient presents with   ??? Establish Care         History of Present Illness:     The patient is a 32 y.o. male  presented to the clinic with complaints as above.    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 As lonfas he does not run it is fine    Weight lifts for exercise, biking w/o issues    Review of Systems:   Review of Systems   Constitutional: Negative.    HENT: Negative for congestion, ear pain, facial swelling, hearing loss, mouth sores, nosebleeds, sinus pain, sneezing, sore throat, tinnitus and voice change.    Eyes: Negative for photophobia, pain, discharge, itching and visual disturbance.   Respiratory: Negative for cough, chest tightness, shortness of breath and wheezing.    Cardiovascular: Negative.    Gastrointestinal: Negative.    Endocrine: Negative.    Genitourinary: Negative.    Musculoskeletal: Negative.    Skin: Negative.    Hematological: Negative.    Psychiatric/Behavioral: Negative for agitation, behavioral problems, confusion, decreased concentration and sleep disturbance. The patient is not nervous/anxious.        Past Medical History:      Diagnosis Date   ??? Chronic knee pain    ??? History of Osgood-Schlatter disease    ??? History of toxoplasmosis    ??? History of varicocele    ??? Infertility male    ??? Osgood-Schlatter's disease        Past Surgical History:        Procedure Laterality Date   ??? KNEE ARTHROSCOPY Right    ??? NECK SURGERY     ??? TESTICLE SURGERY Left        Allergies:    Patient has no known allergies.    Social History:   Social History     Socioeconomic History   ??? Marital status: Single     Spouse name: Not on file   ??? Number of children: Not on file   ??? Years of  education: Not on file   ??? Highest education level: Not on file   Occupational History   ??? Not on file   Social Needs   ??? Financial resource strain: Not on file   ??? Food insecurity:     Worry: Not on file     Inability: Not on file   ??? Transportation needs:     Medical: Not on file     Non-medical: Not on file   Tobacco Use   ??? Smoking status: Never Smoker   ??? Smokeless tobacco: Never Used   Substance and Sexual Activity   ??? Alcohol use: Yes     Comment: occas   ??? Drug use: No   ??? Sexual activity: Not on file   Lifestyle   ??? Physical activity:  Days per week: Not on file     Minutes per session: Not on file   ??? Stress: Not on file   Relationships   ??? Social connections:     Talks on phone: Not on file     Gets together: Not on file     Attends religious service: Not on file     Active member of club or organization: Not on file     Attends meetings of clubs or organizations: Not on file     Relationship status: Not on file   ??? Intimate partner violence:     Fear of current or ex partner: Not on file     Emotionally abused: Not on file     Physically abused: Not on file     Forced sexual activity: Not on file   Other Topics Concern   ??? Not on file   Social History Narrative   ??? Not on file        Family History:       Problem Relation Age of Onset   ??? High Blood Pressure Mother    ??? Other Mother         prediabetes   ??? Other Father         thyroid diorder   ??? Cancer Father         CLL, lymphoma, melanoma    ??? Prostate Cancer Father        BP Readings from Last 3 Encounters:   06/28/16 118/82   04/11/16 138/89   05/26/15 126/88       Physical Exam:    Vitals: Temp 96.5 ??F (35.8 ??C)    Ht  (1.727 m)    Wt 213 lb 6.4 oz (96.8 kg)    BMI 32.45 kg/m??   General Appearance: Well developed, awake, alert, oriented, no acute distress  HEENT: Normocephalic,atraumatic. PERRL, EOM's intact, EAC without erythemaor swelling, no pallor or icterus.   Neck: Supple, symmetrical, trachea midline. No JVD.  Chest wall/Lung: Clear  to auscultation bilaterally,  respirations unlabored. No ronchi/wheezing/rales  Heart::  Regular rate and rhythm, S1and S2 normal, no murmur, rub or gallop.  Abdomen: Soft, non-tender, bowel sounds normoactive, no masses, no organomegaly  Extremities:  Extremities normal, atraumatic, no cyanosis.  no edema.  Skin: Skin color, texture, turgor normal, no rashes or lesions  Musculokeletal: ROM grossly normal in all joints of extremities, no obvious joint swelling.  Lymph nodes: no lymph node enlargement appreciated  Neurologic:   Alert&Oriented.    Normal gait and coordination  No focal neurological deficits appreaciated         Psychiatric: has a normal mood and affect. Behavior is normal.       Assessment and Plan:         1. Encounter for preventative adult health care exam with abnormal findings  Feels good  Stays active  Previous labs reviewed        2 years if all well            I encourage further reading and education about your health conditions.  Information on many healthconditions is provided by the American Academy of Family Physicians: https://familydoctor.org/  Please bring any questions to me at your next visit.    Return to Office: No follow-ups on file.    Medication List:    Current Outpatient Medications   Medication Sig Dispense Refill   ??? Multiple Vitamins-Minerals (MULTIVITAMIN ADULT PO) Take by mouth  No current facility-administered medications for this visit.         Collier Salina, MD       This document may have been prepared at least partiallythrough the use of voice recognition software. Although effort is taken to assure the accuracy of this document, it is possible that grammatical, syntax,  or spelling errors may occur.

## 2017-09-13 NOTE — Telephone Encounter (Signed)
Patient notified of lab results

## 2017-09-13 NOTE — Telephone Encounter (Signed)
-----   Message from Lavella Hammock, MD sent at 09/12/2017  8:03 AM EDT -----  I receive the report on his laboratories. Cholesterol is high at 231, back cholesterol was 156 which is a little bit higher for his age. Good cholesterol levels were good, triglycerides are normal. Kidney and liver function tests were normal. Thyroid   levels were good and blood counts are great.  Digestion and said he takes a good heart look at his diet and see where he can make adjustments. If not, he has to remember that his family history is quite extensive for cholesterol problems and he might need to start cholesterol medication sooner r  ather than later. If he wants, we can repeat it in 6 months and see where his.

## 2017-09-18 NOTE — Telephone Encounter (Signed)
lmtrc

## 2017-09-18 NOTE — Telephone Encounter (Signed)
-----   Message from Luis E Villaplana, MD sent at 09/12/2017  8:03 AM EDT -----  I receive the report on his laboratories. Cholesterol is high at 231, back cholesterol was 156 which is a little bit higher for his age. Good cholesterol levels were good, triglycerides are normal. Kidney and liver function tests were normal. Thyroid   levels were good and blood counts are great.  Digestion and said he takes a good heart look at his diet and see where he can make adjustments. If not, he has to remember that his family history is quite extensive for cholesterol problems and he might need to start cholesterol medication sooner r  ather than later. If he wants, we can repeat it in 6 months and see where his.

## 2017-09-19 NOTE — Telephone Encounter (Signed)
Advised pt of labs. He will watch his diet and have repeat labs in 68m.

## 2018-08-26 ENCOUNTER — Encounter

## 2018-09-01 ENCOUNTER — Inpatient Hospital Stay: Payer: PRIVATE HEALTH INSURANCE | Primary: Family Medicine

## 2018-09-01 DIAGNOSIS — Z20828 Contact with and (suspected) exposure to other viral communicable diseases: Secondary | ICD-10-CM

## 2018-09-03 LAB — COVID-19 AMBULATORY: SARS-CoV-2: NOT DETECTED

## 2019-03-19 ENCOUNTER — Encounter: Attending: Orthopaedic Surgery | Primary: Family Medicine

## 2019-04-08 ENCOUNTER — Ambulatory Visit: Admit: 2019-04-08 | Discharge: 2019-04-30 | Payer: BLUE CROSS/BLUE SHIELD | Primary: Family Medicine

## 2019-04-08 ENCOUNTER — Ambulatory Visit
Admit: 2019-04-08 | Discharge: 2019-04-08 | Payer: BLUE CROSS/BLUE SHIELD | Attending: Orthopaedic Surgery | Primary: Family Medicine

## 2019-04-08 DIAGNOSIS — M25562 Pain in left knee: Secondary | ICD-10-CM

## 2019-04-08 DIAGNOSIS — G8929 Other chronic pain: Secondary | ICD-10-CM

## 2019-04-08 NOTE — Progress Notes (Signed)
Chief Complaint:   Chief Complaint   Patient presents with   ??? Knee Pain     Lt knee pain off and on since 2009.  Pain is located medially.  Pain has been bad x 1 month.  Pain with walking, stair climbing and change of direction.         Kyle Noble presents with persisting and in fact increasing left knee pain, he was seen here in 2017 for this, MRI scan was obtained at that time was negative, physical therapy was recommended but not performed as yet.  Pain at that time the patient felt was tolerable but over the past few months it has progressed, now constant but aggravated by and intermittently disabling from simple activities of standing transfers and stairs especially.  Pain is predominantly anterior to medial, patient describes a snapping sensation with flexion activities but denies mechanical locking giving way.  No known injury.  Not improved with rest or oral medication.  No other joint complaints.    Allergies; medications; past medical, surgical, family, and social history; and problem list have been reviewed today and updated as indicated in this encounter seen below.    Exam: Healthy-appearing young adult male.  Upper extremities intact unremarkable, leg lengths are equal hip motion is painless bilaterally.  Both knees show native varus alignment, right knee is otherwise benign no laxity deformity or effusion with full range of motion.  Left knee also stable to stress medial lateral and AP, negative varus, tenderness is mostly over the anteromedial femoral condyle and anteromedial joint line, McMurray's is negative.  Range of motion is normal.    Radiographs: AP weightbearing knee x-rays including lateral of the left were obtained today, all joint spaces are preserved, patient has native varus alignment with with an approximate 8 degree varus slope to the bilateral tibia.  No periarticular erosions or osteophyte formation.    Kyle Noble was seen today for knee pain.    Diagnoses and all orders for this  visit:    Chronic pain of left knee  -     XR KNEE BILATERAL STANDING  -     XR KNEE LEFT (1-2 VIEWS)  -     Amb External Referral To Physical Therapy       Impressions patellofemoral pain left knee possible plica syndrome.  I did review the patient's MRI from previously it was normal he has had no injury since that time so I do not think advanced imaging at this point will affect clinical decision-making.  We talked about the option of diagnostic arthroscopy and the potential for occult issues such as plica syndrome, but I would agree the patient should attend a course of physical therapy to exhaust all reasonable nonoperative means prior to considering that procedure.  Questions asked answered and understood, he will attend physical therapy and if pain persist follow-up in 6 weeks repeat exam and possible diagnostic arthroscopy left knee.    - Counseling More Than 50% of the Appointment Time: 15 minutes    Return in about 6 weeks (around 05/20/2019).     Current Outpatient Medications   Medication Sig Dispense Refill   ??? Multiple Vitamins-Minerals (MULTIVITAMIN ADULT PO) Take by mouth       No current facility-administered medications for this visit.        Patient Active Problem List   Diagnosis   ??? Migraine equivalent   ??? Unilateral primary osteoarthritis, left knee       Past Medical History:  Diagnosis Date   ??? Chronic knee pain    ??? History of Osgood-Schlatter disease    ??? History of toxoplasmosis    ??? History of varicocele    ??? Infertility male    ??? Osgood-Schlatter's disease        Past Surgical History:   Procedure Laterality Date   ??? KNEE ARTHROSCOPY Right    ??? NECK SURGERY     ??? TESTICLE SURGERY Left        No Known Allergies    Social History     Socioeconomic History   ??? Marital status: Single     Spouse name: None   ??? Number of children: None   ??? Years of education: None   ??? Highest education level: None   Occupational History   ??? None   Social Needs   ??? Financial resource strain: None   ??? Food  insecurity     Worry: None     Inability: None   ??? Transportation needs     Medical: None     Non-medical: None   Tobacco Use   ??? Smoking status: Never Smoker   ??? Smokeless tobacco: Never Used   Substance and Sexual Activity   ??? Alcohol use: Yes     Comment: occas   ??? Drug use: No   ??? Sexual activity: None   Lifestyle   ??? Physical activity     Days per week: None     Minutes per session: None   ??? Stress: None   Relationships   ??? Social Product manager on phone: None     Gets together: None     Attends religious service: None     Active member of club or organization: None     Attends meetings of clubs or organizations: None     Relationship status: None   ??? Intimate partner violence     Fear of current or ex partner: None     Emotionally abused: None     Physically abused: None     Forced sexual activity: None   Other Topics Concern   ??? None   Social History Narrative   ??? None       Family History   Problem Relation Age of Onset   ??? High Blood Pressure Mother    ??? Other Mother         prediabetes   ??? Other Father         thyroid diorder   ??? Cancer Father         CLL, lymphoma, melanoma    ??? Prostate Cancer Father          Review of Systems  As follows except as previously noted in HPI:  Constitutional: Negative for chills, diaphoresis, fatigue, fever and unexpected weight change.   Respiratory: Negative for cough, shortness of breath and wheezing.    Cardiovascular: Negative for chest pain and palpitations.   Neurological: Negative for dizziness, syncope, cephalgia.  GI / GU: negative  Musculoskeletal: see HPI       Objective:   Physical Exam   Constitutional: Oriented to person, place, and time. and appears well-developed and well-nourished. :   Head: Normocephalic and atraumatic.   Eyes: EOM are normal.   Neck: Neck supple.   Cardiovascular: Normal rate and regular rhythm.    Pulmonary/Chest: Effort normal. No stridor. No respiratory distress, no wheezes.   Abdominal:  No abnormal distension.  Neurological: Alert and oriented to person, place, and time.   Skin: Skin is warm and dry.   Psychiatric: Normal mood and affect. Behavior is normal. Thought content normal.    04/08/2019  9:30 PM

## 2019-05-26 ENCOUNTER — Ambulatory Visit
Admit: 2019-05-26 | Discharge: 2019-05-26 | Payer: BLUE CROSS/BLUE SHIELD | Attending: Orthopaedic Surgery | Primary: Family Medicine

## 2019-05-26 DIAGNOSIS — G8929 Other chronic pain: Secondary | ICD-10-CM

## 2019-05-26 DIAGNOSIS — M25562 Pain in left knee: Secondary | ICD-10-CM

## 2019-05-29 NOTE — Telephone Encounter (Signed)
Per recording at [facility]. No pre-cert is required for left knee arthroscopy (62836) 06/22/2019 [employer] OPT.  Call Ref# [id number].

## 2019-06-15 ENCOUNTER — Ambulatory Visit
Admit: 2019-06-15 | Discharge: 2019-06-15 | Payer: BLUE CROSS/BLUE SHIELD | Attending: Family Medicine | Primary: Family Medicine

## 2019-06-15 ENCOUNTER — Encounter

## 2019-06-15 DIAGNOSIS — Z0000 Encounter for general adult medical examination without abnormal findings: Secondary | ICD-10-CM

## 2019-06-15 LAB — LIPID PANEL
Cholesterol, Total: 186 mg/dL (ref 0–199)
HDL: 55 mg/dL (ref >40)
LDL Calculated: 119 mg/dL — ABNORMAL HIGH (ref 0–99)
Triglycerides: 62 mg/dL (ref 0–149)
VLDL Cholesterol Calculated: 12 mg/dL

## 2019-06-15 LAB — COMPREHENSIVE METABOLIC PANEL
ALT: 29 U/L (ref 0–40)
AST: 23 U/L (ref 0–39)
Albumin: 4.4 g/dL (ref 3.5–5.2)
Alkaline Phosphatase: 52 U/L (ref 40–129)
Anion Gap: 7 mmol/L (ref 7–16)
BUN: 18 mg/dL (ref 6–20)
CO2: 29 mmol/L (ref 22–29)
Calcium: 9.7 mg/dL (ref 8.6–10.2)
Chloride: 103 mmol/L (ref 98–107)
Creatinine: 1 mg/dL (ref 0.7–1.2)
GFR African American: >60
GFR Non-African American: >60 mL/min/1.73 (ref >=60)
Glucose: 94 mg/dL (ref 74–99)
Potassium: 4.6 mmol/L (ref 3.5–5.0)
Sodium: 139 mmol/L (ref 132–146)
Total Protein: 7.2 g/dL (ref 6.4–8.3)

## 2019-06-15 LAB — CBC WITH AUTO DIFFERENTIAL
Basophils %: 1.3 % (ref 0.0–2.0)
Basophils Absolute: 0.06 E9/L (ref 0.00–0.20)
Eosinophils %: 2.3 % (ref 0.0–6.0)
Eosinophils Absolute: 0.11 E9/L (ref 0.05–0.50)
Hematocrit: 45.2 % (ref 37.0–54.0)
Hemoglobin: 15.6 g/dL (ref 12.5–16.5)
Immature Granulocytes #: 0.01 E9/L
Immature Granulocytes %: 0.2 % (ref 0.0–5.0)
Lymphocytes %: 50.8 % — ABNORMAL HIGH (ref 20.0–42.0)
Lymphocytes Absolute: 2.4 E9/L (ref 1.50–4.00)
MCH: 30.8 pg (ref 26.0–35.0)
MCHC: 34.5 % (ref 32.0–34.5)
MCV: 89.2 fL (ref 80.0–99.9)
MPV: 11.5 fL (ref 7.0–12.0)
Monocytes %: 10.2 % (ref 2.0–12.0)
Monocytes Absolute: 0.48 E9/L (ref 0.10–0.95)
Neutrophils %: 35.2 % — ABNORMAL LOW (ref 43.0–80.0)
Neutrophils Absolute: 1.66 E9/L — ABNORMAL LOW (ref 1.80–7.30)
Platelets: 204 E9/L (ref 130–450)
RBC: 5.07 E12/L (ref 3.80–5.80)
RDW: 12.2 fL (ref 11.5–15.0)
WBC: 4.7 E9/L (ref 4.5–11.5)

## 2019-06-15 LAB — VITAMIN D 25 HYDROXY: Vit D, 25-Hydroxy: 29 ng/mL — ABNORMAL LOW (ref 30–100)

## 2019-06-15 LAB — TSH: TSH: 1.39 u[IU]/mL (ref 0.270–4.200)

## 2019-06-16 NOTE — Progress Notes (Signed)
Have you been tested for COVID  Yes           Have you been told you were positive for COVID Yes COVID 03-2019 - resolved as of 06-16-19   Have you had any known exposure to someone that is positive for COVID No  Do you have a cough                   No              Do you have shortness of breath No                 Do you have a sore throat            No                Are you having chills                    No                Are you having muscle aches.     No                    Please come to the hospital wearing a mask and have your significant other wear a mask as well.  Both of you should check your temperature before leaving to come here,  if it is 100 or higher please call [phone] for instruction.      [facility] PRE-ADMISSION TESTING INSTRUCTIONS    The Preadmission Testing patient is instructed accordingly using the following criteria (check applicable):    ARRIVAL INSTRUCTIONS:  [x]  Parking the day of Surgery is located in the Main Entrance lot.  Upon entering the door, make an immediate right to the surgery reception desk    [x]  Bring photo ID and insurance card    []  Bring in a copy of Living will or Durable Power of attorney papers.    [x]  Please be sure to arrange for responsible adult to provide transportation to and from the hospital    [x]  Please arrange for responsible adult to be with you for the 24 hour period post procedure due to having anesthesia      GENERAL INSTRUCTIONS:    [x]  Nothing by mouth after midnight, including gum, candy, mints or water    [x]  You may brush your teeth, but do not swallow any water    []  Take medications as instructed with 1-2 oz of water    [x]  Stop herbal supplements and vitamins 5 days prior to procedure    [x]  Follow preop dosing of blood thinners per physician instructions    []  Take 1/2 dose of evening insulin, but no insulin after midnight    []  No oral diabetic medications after midnight    []  If diabetic and have low  blood sugar or feel symptomatic, take 1-2oz apple juice only    []  Bring inhalers day of surgery    []  Bring C-PAP/ Bi-Pap day of surgery    []  Bring urine specimen day of surgery    [x]  Shower or bath with soap, lather and rinse well, AM of Surgery, no lotion, powders or creams to surgical site    []  Follow bowel prep as instructed per surgeon    [x]  No tobacco products within 24 hours of surgery     [x]  No alcohol or illegal drug use within  24 hours of surgery.    [x]  Jewelry, body piercing's, eyeglasses, contact lenses and dentures are not permitted into surgery (bring cases)      []  Please do not wear any nail polish, make up or hair products on the day of surgery     [x]  You can expect a call the business day prior to procedure to notify you if your arrival time changes    [x]  If you receive a survey after surgery we would greatly appreciate your comments    []  Parent/guardian of a minor must accompany their child and remain on the premises  the entire time they are under our care     []  Pediatric patients may bring favorite toy, blanket or comfort item with them    []  A caregiver or family member must remain with the patient during their stay if they are mentally handicapped, have dementia, disoriented or unable to use a call light or would be a safety concern if left unattended    [x]  Please notify surgeon if you develop any illness between now and time of surgery (cold, cough, sore throat, fever, nausea, vomiting) or any signs of infections  including skin, wounds, and dental.    [x]   The Outpatient Pharmacy is available to fill your prescription here on your day of surgery, ask your preop nurse for details    [x]  Other instructions    EDUCATIONAL MATERIALS PROVIDED:    []  PAT Preoperative Education Packet/Booklet     []  Medication List    []  Transfusion bracelet applied with instructions    []  Shower with soap, lather and rinse well, and use CHG wipes provided the evening before surgery as instructed    []   Incentive spirometer with instructions

## 2019-06-17 ENCOUNTER — Inpatient Hospital Stay: Payer: BLUE CROSS/BLUE SHIELD | Primary: Family Medicine

## 2019-06-17 DIAGNOSIS — Z01818 Encounter for other preprocedural examination: Secondary | ICD-10-CM

## 2019-06-18 LAB — COVID-19 AMBULATORY: SARS-CoV-2: NOT DETECTED

## 2019-06-18 NOTE — H&P (Signed)
Department of Orthopedic Surgery  Attending History and Physical        CHIEF COMPLAINT: Left knee pain    Reason for Admission: Arthroscopic surgery left knee    History Obtained From: patient, electronic medical record    HISTORY OF PRESENT ILLNESS:      The patient is a 34 y.o. male with significant past medical history of COVID-19, resolved who presents with chronic persistent and progressive left knee pain.  No recent history of injury, pain has persisted not responded to activity modification therapeutic exercise oral medication.  Pain is located at the anterior and medial aspects of the knee associated with mechanical snapping feeling, interfering with daily activities especially those involving sit to stand transfers standing bending and ambulation.  He presents now for arthroscopic intervention left knee.    Past Medical History:        Diagnosis Date   ??? Chronic knee pain    ??? COVID-19 03/2019    resolved as of 06-16-19    ??? History of toxoplasmosis    ??? History of varicocele    ??? Infertility male    ??? Osgood-Schlatter's disease      Past Surgical History:        Procedure Laterality Date   ??? KNEE ARTHROSCOPY Right    ??? NECK SURGERY     ??? TESTICLE SURGERY Left    ??? WISDOM TOOTH EXTRACTION       Immunizations:              Influenza:  Not indicated            Pneumococcal Polysaccharide:  Not indicated    Medications Prior to Admission:     Current Outpatient Medications:   ???  Naproxen Sodium (ALEVE) 220 MG CAPS, Take by mouth, Disp: , Rfl:   ???  Multiple Vitamins-Minerals (MULTIVITAMIN ADULT PO), Take by mouth, Disp: , Rfl:       Allergies:  Patient has no known allergies.    Social History:   TOBACCO:   reports that he has never smoked. He has never used smokeless tobacco.  ETOH:   reports current alcohol use.  Patient currently lives in a private home, employed as an Technical brewer.  Family History:       Problem Relation Age of Onset   ??? High Blood Pressure Mother    ??? Diabetes Mother    ??? Migraines Mother     ??? Other Father         thyroid diorder   ??? Cancer Father         CLL, lymphoma, melanoma    ??? Prostate Cancer Father    ??? Elevated Lipids Father    ??? Hypertension Father      REVIEW OF SYSTEMS:  Constitutional: Negative for chills, diaphoresis, fatigue, fever and unexpected weight change.   Respiratory: Negative for cough, shortness of breath and wheezing.    Cardiovascular: Negative for chest pain and palpitations.   Neurological: Negative for dizziness, syncope, weakness and numbness.  Musculoskeletal: see HPI     PHYSICAL EXAM:  VITALS:  There were no vitals taken for this visit.  CONSTITUTIONAL: Healthy well-developed adult male  NECK:  supple, symmetrical, trachea midline  LUNGS:  clear  CARDIOVASCULAR:  Regular rate and rhythm  MUSCULOSKELETAL:  Leg lengths equal hip motion normal and painless bilaterally.  Right knee straight and stable without laxity deformity effusion, left knee also normally aligned stable to medial lateral and AP stress, full  motion no crepitus, tender to palpation at the anteromedial joint line.  McMurray's negative.  NEUROLOGIC:  Grossly intact motor and sensation  Mental Status Exam:  Level of Alertness:   awake  Orientation:   person, place, time    DATA:  CBC with Differential:    Lab Results   Component Value Date    WBC 4.7 06/15/2019    RBC 5.07 06/15/2019    HGB 15.6 06/15/2019    HCT 45.2 06/15/2019    PLT 204 06/15/2019    MCV 89.2 06/15/2019    MCH 30.8 06/15/2019    MCHC 34.5 06/15/2019    RDW 12.2 06/15/2019    SEGSPCT 53 01/24/2011    LYMPHOPCT 50.8 06/15/2019    MONOPCT 10.2 06/15/2019    BASOPCT 1.3 06/15/2019    MONOSABS 0.48 06/15/2019    LYMPHSABS 2.40 06/15/2019    EOSABS 0.11 06/15/2019    BASOSABS 0.06 06/15/2019       Radiographs: X-rays and previous MRI scan of left knee did not disclose evidence of structural disease.    ASSESSMENT AND PLAN:    Internal derangement left knee with chronic pain.    Patient has had chronic and persistent disabling symptoms of left  knee pain that have not responded to appropriate nonsurgical means.  Diagnostic alternatives include occult meniscal or articular pathology possible plica syndrome.  The alternative of diagnostic arthroscopy with intervention depending upon intraoperative findings were reviewed with the patient including the procedure itself anticipated risk benefits and probable or possible outcomes again depending upon intraoperative findings.  Questions were asked answered and understood.  This would be performed on an outpatient basis under [facility].

## 2019-06-22 ENCOUNTER — Inpatient Hospital Stay: Payer: BLUE CROSS/BLUE SHIELD

## 2019-06-22 MED ORDER — BUPIVACAINE HCL (PF) 0.25 % IJ SOLN
0.25 | INTRAMUSCULAR | Status: AC
Start: 2019-06-22 — End: 2019-06-22

## 2019-06-22 MED ORDER — ONDANSETRON HCL 4 MG/2ML IJ SOLN
4 MG/2ML | INTRAMUSCULAR | Status: DC | PRN
Start: 2019-06-22 — End: 2019-06-22
  Administered 2019-06-22: 12:00:00 4 via INTRAVENOUS

## 2019-06-22 MED ORDER — MIDAZOLAM HCL 2 MG/2ML IJ SOLN
2 MG/ML | INTRAMUSCULAR | Status: DC | PRN
Start: 2019-06-22 — End: 2019-06-22
  Administered 2019-06-22: 12:00:00 2 via INTRAVENOUS

## 2019-06-22 MED ORDER — NORMAL SALINE FLUSH 0.9 % IV SOLN
0.9 % | INTRAVENOUS | Status: DC | PRN
Start: 2019-06-22 — End: 2019-06-22

## 2019-06-22 MED ORDER — LIDOCAINE HCL (PF) 2 % IJ SOLN
2 | INTRAMUSCULAR | Status: AC
Start: 2019-06-22 — End: 2019-06-22

## 2019-06-22 MED ORDER — FENTANYL CITRATE (PF) 250 MCG/5ML IJ SOLN
250 | INTRAMUSCULAR | Status: AC
Start: 2019-06-22 — End: 2019-06-22

## 2019-06-22 MED ORDER — SODIUM CHLORIDE 0.9 % IV SOLN
0.9 % | INTRAVENOUS | Status: DC | PRN
Start: 2019-06-22 — End: 2019-06-22
  Administered 2019-06-22: 12:00:00 via INTRAVENOUS

## 2019-06-22 MED ORDER — MORPHINE SULFATE 4 MG/ML IJ SOLN
4 | INTRAMUSCULAR | Status: AC
Start: 2019-06-22 — End: 2019-06-22

## 2019-06-22 MED ORDER — LIDOCAINE HCL 1% INJ (MIXTURES ONLY)
1 % | INTRAMUSCULAR | Status: DC | PRN
Start: 2019-06-22 — End: 2019-06-22
  Administered 2019-06-22: 12:00:00 41 via INTRAMUSCULAR

## 2019-06-22 MED ORDER — BUPIVACAINE HCL (PF) 0.25 % IJ SOLN
0.25 % | INTRAMUSCULAR | Status: DC | PRN
Start: 2019-06-22 — End: 2019-06-22
  Administered 2019-06-22: 12:00:00 12 via INTRADERMAL

## 2019-06-22 MED ORDER — MIDAZOLAM HCL 2 MG/2ML IJ SOLN
2 | INTRAMUSCULAR | Status: AC
Start: 2019-06-22 — End: 2019-06-22

## 2019-06-22 MED ORDER — HYDROMORPHONE HCL 1 MG/ML IJ SOLN
1 MG/ML | INTRAMUSCULAR | Status: DC | PRN
Start: 2019-06-22 — End: 2019-06-22

## 2019-06-22 MED ORDER — ONDANSETRON HCL 4 MG/2ML IJ SOLN
4 MG/2ML | Freq: Once | INTRAMUSCULAR | Status: DC | PRN
Start: 2019-06-22 — End: 2019-06-22

## 2019-06-22 MED ORDER — ONDANSETRON HCL 4 MG/2ML IJ SOLN
4 | INTRAMUSCULAR | Status: AC
Start: 2019-06-22 — End: 2019-06-22

## 2019-06-22 MED ORDER — PROPOFOL 500 MG/50ML IV EMUL
500 MG/50ML | INTRAVENOUS | Status: DC | PRN
Start: 2019-06-22 — End: 2019-06-22
  Administered 2019-06-22: 12:00:00 100 via INTRAVENOUS

## 2019-06-22 MED ORDER — LIDOCAINE HCL 1 % IJ SOLN
1 | INTRAMUSCULAR | Status: AC
Start: 2019-06-22 — End: 2019-06-22

## 2019-06-22 MED ORDER — NORMAL SALINE FLUSH 0.9 % IV SOLN
0.9 % | Freq: Two times a day (BID) | INTRAVENOUS | Status: DC
Start: 2019-06-22 — End: 2019-06-22

## 2019-06-22 MED ORDER — LIDOCAINE HCL 1 % IJ SOLN
1 % | INTRAMUSCULAR | Status: DC | PRN
Start: 2019-06-22 — End: 2019-06-22
  Administered 2019-06-22: 12:00:00 10 via INTRADERMAL

## 2019-06-22 MED ORDER — MEPERIDINE HCL 25 MG/ML IJ SOLN
25 MG/ML | INTRAMUSCULAR | Status: DC | PRN
Start: 2019-06-22 — End: 2019-06-22

## 2019-06-22 MED ORDER — OXYCODONE-ACETAMINOPHEN 5-325 MG PO TABS
5-325 MG | ORAL_TABLET | Freq: Four times a day (QID) | ORAL | 0 refills | Status: AC | PRN
Start: 2019-06-22 — End: 2019-06-25

## 2019-06-22 MED ORDER — LIDOCAINE HCL (PF) 2 % IJ SOLN
2 % | INTRAMUSCULAR | Status: DC | PRN
Start: 2019-06-22 — End: 2019-06-22
  Administered 2019-06-22: 12:00:00 100 via INTRAVENOUS

## 2019-06-22 MED ORDER — FENTANYL CITRATE (PF) 250 MCG/5ML IJ SOLN
250 MCG/5ML | INTRAMUSCULAR | Status: DC | PRN
Start: 2019-06-22 — End: 2019-06-22
  Administered 2019-06-22: 12:00:00 50 via INTRAVENOUS
  Administered 2019-06-22: 12:00:00 100 via INTRAVENOUS
  Administered 2019-06-22 (×2): 50 via INTRAVENOUS

## 2019-06-22 MED ORDER — PROPOFOL 500 MG/50ML IV EMUL
500 | INTRAVENOUS | Status: AC
Start: 2019-06-22 — End: 2019-06-22

## 2019-06-22 MED FILL — MORPHINE SULFATE 4 MG/ML IJ SOLN: 4 mg/mL | INTRAMUSCULAR | Qty: 1

## 2019-06-22 MED FILL — MIDAZOLAM HCL 2 MG/2ML IJ SOLN: 2 mg/mL | INTRAMUSCULAR | Qty: 2

## 2019-06-22 MED FILL — BUPIVACAINE HCL (PF) 0.25 % IJ SOLN: 0.25 % | INTRAMUSCULAR | Qty: 10

## 2019-06-22 MED FILL — XYLOCAINE-MPF 2 % IJ SOLN: 2 % | INTRAMUSCULAR | Qty: 5

## 2019-06-22 MED FILL — BUPIVACAINE HCL (PF) 0.25 % IJ SOLN: 0.25 % | INTRAMUSCULAR | Qty: 30

## 2019-06-22 MED FILL — PROPOFOL 500 MG/50ML IV EMUL: 500 MG/50ML | INTRAVENOUS | Qty: 50

## 2019-06-22 MED FILL — FENTANYL CITRATE (PF) 250 MCG/5ML IJ SOLN: 250 MCG/5ML | INTRAMUSCULAR | Qty: 5

## 2019-06-22 MED FILL — XYLOCAINE 1 % IJ SOLN: 1 % | INTRAMUSCULAR | Qty: 40

## 2019-06-22 MED FILL — ONDANSETRON HCL 4 MG/2ML IJ SOLN: 4 MG/2ML | INTRAMUSCULAR | Qty: 2

## 2019-06-22 NOTE — Discharge Instructions (Signed)
Leg lifts weight bearing and knee motion to tolerance  Elevate left leg, ice, rest to reduce pain and swelling as needed  Tylenol or ibuprofen for pain, percocet if needed  Remove dressing and shower in 2 days  Call Dr. Loman Chroman if any problems and to confirm appt for next week.

## 2019-06-22 NOTE — Anesthesia Pre-Procedure Evaluation (Addendum)
Department of Anesthesiology  Preprocedure Note       Name:  Kyle Noble   Age:  34 y.o.  DOB:  1985/10/17                                          MRN:  [id number]         Date:  06/22/2019      Surgeon: Juliann Mule):  Truddie Coco, MD    Procedure: Procedure(s):  LEFT KNEE ARTHROSCOPY    Medications prior to admission:   Prior to Admission medications    Medication Sig Start Date End Date Taking? Authorizing Provider   Naproxen Sodium (ALEVE) 220 MG CAPS Take by mouth   Yes Historical Provider, MD   Multiple Vitamins-Minerals (MULTIVITAMIN ADULT PO) Take by mouth   Yes Historical Provider, MD       Current medications:    Current Facility-Administered Medications   Medication Dose Route Frequency Provider Last Rate Last Admin   ??? sodium chloride flush 0.9 % injection 10 mL  10 mL Intravenous 2 times per day Truddie Coco, MD       ??? sodium chloride flush 0.9 % injection 10 mL  10 mL Intravenous PRN Truddie Coco, MD           Allergies:  No Known Allergies    Problem List:    Patient Active Problem List   Diagnosis Code   ??? 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 Chronic knee pain    ??? COVID-19 03/2019    resolved as of 06-16-19    ??? History of toxoplasmosis    ??? History of varicocele    ??? Infertility male    ??? Osgood-Schlatter's disease        Past Surgical History:        Procedure Laterality Date   ??? KNEE ARTHROSCOPY Right    ??? NECK SURGERY     ??? TESTICLE SURGERY Left    ??? WISDOM TOOTH EXTRACTION         Social History:    Social History     Tobacco Use   ??? Smoking status: Never Smoker   ??? Smokeless tobacco: Never Used   Substance Use Topics   ??? Alcohol use: Yes     Comment: occas                                Counseling given: Not Answered      Vital Signs (Current):   Vitals:    06/16/19 0841 06/22/19 0558 06/22/19 0608   BP:   115/74   Pulse:   76   Resp:   18   Temp:  96.9 ??F (36.1 ??C)    SpO2:    97%   Weight: 192 lb (87.1 kg) 192 lb (87.1 kg)    Height: _0  (1.727 m) _1  (1.727 m)                                               BP  Readings from Last 3 Encounters:   06/22/19 115/74   06/15/19 118/74   06/28/16 118/82       NPO Status: Time of last liquid consumption: 2100                        Time of last solid consumption: 2100                        Date of last liquid consumption: 06/21/19                        Date of last solid food consumption: 06/21/19    BMI:   Wt Readings from Last 3 Encounters:   06/22/19 192 lb (87.1 kg)   06/15/19 192 lb (87.1 kg)   05/26/19 197 lb (89.4 kg)     Body mass index is 29.19 kg/m??.    CBC:   Lab Results   Component Value Date    WBC 4.7 06/15/2019    RBC 5.07 06/15/2019    HGB 15.6 06/15/2019    HCT 45.2 06/15/2019    MCV 89.2 06/15/2019    RDW 12.2 06/15/2019    PLT 204 06/15/2019       CMP:   Lab Results   Component Value Date    NA 139 06/15/2019    K 4.6 06/15/2019    CL 103 06/15/2019    CO2 29 06/15/2019    BUN 18 06/15/2019    CREATININE 1.0 06/15/2019    CREATININE 1.0 06/30/2016    GFRAA >60 06/15/2019    LABGLOM >60 06/15/2019    GLUCOSE 94 06/15/2019    PROT 7.2 06/15/2019    CALCIUM 9.7 06/15/2019    BILITOT 0.3 06/15/2019    ALKPHOS 52 06/15/2019    AST 23 06/15/2019    ALT 29 06/15/2019       POC Tests: No results for input(s): POCGLU, POCNA, POCK, POCCL, POCBUN, POCHEMO, POCHCT in the last 72 hours.    Coags: No results found for: PROTIME, INR, APTT    HCG (If Applicable): No results found for: PREGTESTUR, PREGSERUM, HCG, HCGQUANT     ABGs: No results found for: PHART, PO2ART, PCO2ART, HCO3ART, BEART, O2SATART     Type & Screen (If Applicable):  No results found for: LABABO, LABRH    Drug/Infectious Status (If Applicable):  No results found for: HIV, HEPCAB    COVID-19 Screening (If Applicable):   Lab Results   Component Value Date    COVID19 Not Detected 06/17/2019         Anesthesia Evaluation  Patient summary reviewed no history of anesthetic  complications:   Airway: Mallampati: II  TM distance: >3 FB   Neck ROM: full  Mouth opening: > = 3 FB Dental: normal exam         Pulmonary:Negative Pulmonary ROS breath sounds clear to auscultation                             Cardiovascular:Negative CV ROS            Rhythm: regular  Rate: normal                    Neuro/Psych:   (+) headaches:,             GI/Hepatic/Renal: Neg GI/Hepatic/Renal ROS  Endo/Other: Negative Endo/Other ROS                    Abdominal:         (-) obese     Vascular: negative vascular ROS.                                       Anesthesia Plan      MAC     ASA 2     (History of COVID-19  Tested positive in Nov. 2020--Stable)  Induction: intravenous.    MIPS: Postoperative opioids intended and Prophylactic antiemetics administered.  Anesthetic plan and risks discussed with patient and spouse.      Plan discussed with CRNA.                Idell Pickles, MD   06/22/2019

## 2019-06-22 NOTE — Anesthesia Post-Procedure Evaluation (Signed)
Department of Anesthesiology  Postprocedure Note    Patient: Kyle Noble  MRN: [id number]  Birthdate: 07-Feb-1986  Date of evaluation: 06/22/2019  Time:  10:15 PM     Procedure Summary     Date: 06/22/19 Room / Location: [facility] OR 05 / [facility]    Anesthesia Start: [phone] Anesthesia Stop: 9390    Procedure: LEFT KNEE ARTHROSCOPY (Left Knee) Diagnosis: (LEFT KNEE PAIN INTERNAL DERANGEMENT)    Surgeons: Truddie Coco, MD Responsible Provider: Idell Pickles, MD    Anesthesia Type: MAC ASA Status: 2          Anesthesia Type: MAC    Aldrete Phase I: Aldrete Score: 10    Aldrete Phase II: Aldrete Score: 10    Last vitals: Reviewed and per EMR flowsheets.       Anesthesia Post Evaluation    Patient location during evaluation: PACU  Patient participation: complete - patient participated  Level of consciousness: awake and alert  Airway patency: patent  Nausea & Vomiting: no vomiting and no nausea  Complications: no  Cardiovascular status: hemodynamically stable  Respiratory status: acceptable  Hydration status: stable

## 2019-06-22 NOTE — Progress Notes (Signed)
Discharge and anesthesia instructions given to patient and spouse, denies any questions

## 2019-06-22 NOTE — Interval H&P Note (Signed)
Update History & Physical    The patient's History and Physical of June 18, 2019 was reviewed with the patient and I examined the patient. There was no change. The surgical site was confirmed by the patient and me.     Plan: The risks, benefits, expected outcome, and alternative to the recommended procedure have been discussed with the patient. Patient understands and wants to proceed with the procedure.     Electronically signed by Coralee Rud, MD on 06/22/2019 at 6:34 AM

## 2019-06-30 ENCOUNTER — Ambulatory Visit
Admit: 2019-06-30 | Discharge: 2019-06-30 | Payer: BLUE CROSS/BLUE SHIELD | Attending: Orthopaedic Surgery | Primary: Family Medicine

## 2019-06-30 DIAGNOSIS — Z9889 Other specified postprocedural states: Secondary | ICD-10-CM

## 2019-06-30 NOTE — Progress Notes (Signed)
Chief Complaint:   Chief Complaint   Patient presents with   ??? Post-Op Check     Post-op Left knee arthroscopy 06/22/2019.        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  Chondroplasty was performed as well as incidental resection of a medial plica.  Patient is doing well at this point with back to work the next day, he is managing pain with occasional over-the-counter's at this time, denies fever chills sweats chest pain or dyspnea.    Allergies; medications; past medical, surgical, family, and social history; and problem list have been reviewed today and updated as indicated in this encounter seen below.    Exam: Left knee shows no effusion erythema, portals are healing without drainage or erythema.  Patellar tracking normal without crepitus today, knee is stable to medial lateral stress, mild anteromedial tenderness to palpation.    Radiographs: Deferred benign clinical exam.    Law was seen today for post-op check.    Diagnoses and all orders for this visit:    Status post arthroscopy of left knee       Doing well, sutures removed, patient was detailed on proper home exercise with gradual resumption of activities to tolerance although he went back to work the day after surgery.  Questions asked and answered follow-up in 1 month for clinical exam.      Return in about 1 month (around 07/31/2019).     Current Outpatient Medications   Medication Sig Dispense Refill   ??? ibuprofen (ADVIL;MOTRIN) 200 MG tablet Take 800 mg by mouth 2 times daily as needed for Pain     ??? Multiple Vitamins-Minerals (MULTIVITAMIN ADULT PO) Take by mouth       No current facility-administered medications for this visit.        Patient Active  Problem List   Diagnosis   ??? Migraine equivalent   ??? Unilateral primary osteoarthritis, left knee   ??? S/P arthroscopy of left knee       Past Medical History:   Diagnosis Date   ??? Chronic knee pain    ??? COVID-19 03/2019    resolved as of 06-16-19    ??? History of toxoplasmosis    ??? History of varicocele    ??? Infertility male    ??? Osgood-Schlatter's disease        Past Surgical History:   Procedure Laterality Date   ??? KNEE ARTHROSCOPY Right    ??? KNEE ARTHROSCOPY Left 06/22/2019    LEFT KNEE ARTHROSCOPY performed by Truddie Coco, MD at [facility]   ??? NECK SURGERY     ??? TESTICLE SURGERY Left    ??? WISDOM TOOTH EXTRACTION         No Known Allergies    Social History     Socioeconomic History   ??? Marital status: Married     Spouse name: None   ??? Number of children: None   ??? Years of education: None   ??? Highest education level: None   Occupational History   ??? None   Social Needs   ??? Financial resource strain: None   ??? Food insecurity     Worry: None     Inability: None   ???  Transportation needs     Medical: None     Non-medical: None   Tobacco Use   ??? Smoking status: Never Smoker   ??? Smokeless tobacco: Never Used   Substance and Sexual Activity   ??? Alcohol use: Yes     Comment: occas   ??? Drug use: No   ??? Sexual activity: None   Lifestyle   ??? Physical activity     Days per week: None     Minutes per session: None   ??? Stress: None   Relationships   ??? Social Wellsite geologist on phone: None     Gets together: None     Attends religious service: None     Active member of club or organization: None     Attends meetings of clubs or organizations: None     Relationship status: None   ??? Intimate partner violence     Fear of current or ex partner: None     Emotionally abused: None     Physically abused: None     Forced sexual activity: None   Other Topics Concern   ??? None   Social History Narrative   ??? None       Family History   Problem Relation Age of Onset   ??? High Blood Pressure Mother    ??? Diabetes Mother    ??? Migraines  Mother    ??? Other Father         thyroid diorder   ??? Cancer Father         CLL, lymphoma, melanoma    ??? Prostate Cancer Father    ??? Elevated Lipids Father    ??? Hypertension Father          Review of Systems  As follows except as previously noted in HPI:  Constitutional: Negative for chills, diaphoresis, fatigue, fever and unexpected weight change.   Respiratory: Negative for cough, shortness of breath and wheezing.    Cardiovascular: Negative for chest pain and palpitations.   Neurological: Negative for dizziness, syncope, cephalgia.  GI / GU: negative  Musculoskeletal: see HPI       Objective:   Physical Exam   Constitutional: Oriented to person, place, and time. and appears well-developed and well-nourished. :   Head: Normocephalic and atraumatic.   Eyes: EOM are normal.   Neck: Neck supple.   Cardiovascular: Normal rate and regular rhythm.    Pulmonary/Chest: [facility] normal. No stridor. No respiratory distress, no wheezes.   Abdominal:  No abnormal distension.    Neurological: Alert and oriented to person, place, and time.   Skin: Skin is warm and dry.   Psychiatric: Normal mood and affect. Behavior is normal. Thought content normal.    06/30/2019  3:21 PM

## 2019-07-06 MED ORDER — SUMATRIPTAN SUCCINATE 25 MG PO TABS
25 MG | ORAL_TABLET | Freq: Once | ORAL | 5 refills | Status: DC | PRN
Start: 2019-07-06 — End: 2020-08-29

## 2019-07-06 NOTE — Telephone Encounter (Signed)
From: Lanice Schwab  To: Garner Nash, DO  Sent: 07/06/2019 10:21 AM EST  Subject: Non-Urgent Medical Question    Hey Dr. Hilarie Fredrickson,  I meant to ask you when I was in your office but forgot.Marland KitchenMarland KitchenI get migraines (pretty infrequently and usually related to stress). I've been occasionally borrowing an Imitrex here and there from my parents to cope with these. Is there anyway you could write for a small Imitrex script for me? Maybe 1 or 2 per month.  Thanks!  Kyle Noble

## 2019-08-11 ENCOUNTER — Ambulatory Visit
Admit: 2019-08-11 | Discharge: 2019-08-11 | Payer: BLUE CROSS/BLUE SHIELD | Attending: Orthopaedic Surgery | Primary: Family Medicine

## 2019-08-11 DIAGNOSIS — Z9889 Other specified postprocedural states: Secondary | ICD-10-CM

## 2019-08-11 NOTE — Progress Notes (Signed)
Chief Complaint:   Chief Complaint   Patient presents with   ??? Post-Op Check     [facility] left knee arthroscopy 06/22/2019. Dong well.  Apprehensive about running.        Kyle Noble is approaching 2 months after left knee arthroscopy and chondroplasty, overall doing very well reports significant relief of preoperative pain, he has been rehabilitating and doing exercise at home, has not tried to run as yet and is a little bit anxious about doing so.  Taking nothing for pain, denies mechanical locking giving way.  Has not really noticed any swelling either.    Allergies; medications; past medical, surgical, family, and social history; and problem list have been reviewed today and updated as indicated in this encounter seen below.    Exam: Left knee is benign, no joint line tenderness negative McMurray's no medial lateral laxity full motion no effusion.    Radiographs: Not applicable benign clinical exam.    Kyle Noble was seen today for post-op check.    Diagnoses and all orders for this visit:    Status post arthroscopy of left knee       Doing well, advised to gradually increase activities to tolerance continue with home exercise on a maintenance basis questions asked and answered follow-up here if any symptoms recur.      Return if symptoms worsen or fail to improve.     Current Outpatient Medications   Medication Sig Dispense Refill   ??? SUMAtriptan (IMITREX) 25 MG tablet Take 1 tablet by mouth once as needed for Migraine 9 tablet 5   ??? Multiple Vitamins-Minerals (MULTIVITAMIN ADULT PO) Take by mouth     ??? ibuprofen (ADVIL;MOTRIN) 200 MG tablet Take 800 mg by mouth 2 times daily as needed for Pain       No current facility-administered medications for this visit.        Patient Active Problem List   Diagnosis   ??? Migraine equivalent   ??? Unilateral primary osteoarthritis, left knee   ??? S/P arthroscopy of left knee       Past Medical History:   Diagnosis Date   ??? Chronic knee pain    ??? COVID-19 03/2019    resolved as of  06-16-19    ??? History of toxoplasmosis    ??? History of varicocele    ??? Infertility male    ??? Osgood-Schlatter's disease        Past Surgical History:   Procedure Laterality Date   ??? KNEE ARTHROSCOPY Right    ??? KNEE ARTHROSCOPY Left 06/22/2019    LEFT KNEE ARTHROSCOPY performed by Coralee Rud, MD at [facility] OR   ??? NECK SURGERY     ??? TESTICLE SURGERY Left    ??? WISDOM TOOTH EXTRACTION         No Known Allergies    Social History     Socioeconomic History   ??? Marital status: Married     Spouse name: None   ??? Number of children: None   ??? Years of education: None   ??? Highest education level: None   Occupational History   ??? None   Social Needs   ??? Financial resource strain: None   ??? Food insecurity     Worry: None     Inability: None   ??? Transportation needs     Medical: None     Non-medical: None   Tobacco Use   ??? Smoking status: Never Smoker   ??? Smokeless  tobacco: Never Used   Substance and Sexual Activity   ??? Alcohol use: Yes     Comment: occas   ??? Drug use: No   ??? Sexual activity: None   Lifestyle   ??? Physical activity     Days per week: None     Minutes per session: None   ??? Stress: None   Relationships   ??? Social Product manager on phone: None     Gets together: None     Attends religious service: None     Active member of club or organization: None     Attends meetings of clubs or organizations: None     Relationship status: None   ??? Intimate partner violence     Fear of current or ex partner: None     Emotionally abused: None     Physically abused: None     Forced sexual activity: None   Other Topics Concern   ??? None   Social History Narrative   ??? None       Family History   Problem Relation Age of Onset   ??? High Blood Pressure Mother    ??? Diabetes Mother    ??? Migraines Mother    ??? Other Father         thyroid diorder   ??? Cancer Father         CLL, lymphoma, melanoma    ??? Prostate Cancer Father    ??? Elevated Lipids Father    ??? Hypertension Father          Review of Systems  As follows except as previously  noted in HPI:  Constitutional: Negative for chills, diaphoresis, fatigue, fever and unexpected weight change.   Respiratory: Negative for cough, shortness of breath and wheezing.    Cardiovascular: Negative for chest pain and palpitations.   Neurological: Negative for dizziness, syncope, cephalgia.  GI / GU: negative  Musculoskeletal: see HPI       Objective:   Physical Exam   Constitutional: Oriented to person, place, and time. and appears well-developed and well-nourished. :   Head: Normocephalic and atraumatic.   Eyes: EOM are normal.   Neck: Neck supple.   Cardiovascular: Normal rate and regular rhythm.    Pulmonary/Chest: [facility] normal. No stridor. No respiratory distress, no wheezes.   Abdominal:  No abnormal distension.    Neurological: Alert and oriented to person, place, and time.   Skin: Skin is warm and dry.   Psychiatric: Normal mood and affect. Behavior is normal. Thought content normal.    08/11/2019  4:38 PM

## 2020-06-14 ENCOUNTER — Encounter: Attending: Family Medicine | Primary: Family Medicine

## 2020-06-21 ENCOUNTER — Encounter: Attending: Family Medicine | Primary: Family Medicine

## 2020-08-29 ENCOUNTER — Encounter
Admit: 2020-08-29 | Discharge: 2020-08-29 | Payer: BLUE CROSS/BLUE SHIELD | Attending: Family Medicine | Primary: Family Medicine

## 2020-08-29 DIAGNOSIS — Z0000 Encounter for general adult medical examination without abnormal findings: Secondary | ICD-10-CM

## 2020-08-29 MED ORDER — SUMATRIPTAN SUCCINATE 25 MG PO TABS
25 MG | ORAL_TABLET | Freq: Once | ORAL | 3 refills | Status: AC | PRN
Start: 2020-08-29 — End: 2020-08-29

## 2020-08-29 NOTE — Progress Notes (Signed)
08/29/20  Kyle Noble DOB: [birth year] Sex: male  Age: 35 y.o.    Chief Complaint   Patient presents with   ??? Annual Exam     HPI:  35 y.o. male presents today for yearly physical.  Patient's chart, medical, surgical and medication history all reviewed.    Well Adult Physical  Patient here for a physical exam.  The patient reports no problems.    Do you take any herbs or supplements that were not prescribed by a doctor? no   Are you taking calcium supplements? no   Are you taking aspirin daily? no    Colonoscopy: No prior colonoscopy  Dental visit: Within last 6 mos  Vision check:  No Problems    Last time routine bloodwork was done: Feb 2021    Immunization status: up to date and documented.    Smoking status: never    Physical activity: weight lifting, frequently    ROS:  Review of Systems   Constitutional: Negative for chills, fatigue and fever.   Respiratory: Negative for cough, shortness of breath and wheezing.    Cardiovascular: Negative for chest pain and palpitations.   Gastrointestinal: Negative for abdominal pain, constipation, diarrhea, nausea and vomiting.   Musculoskeletal: Negative for arthralgias and back pain.   Skin: Negative for rash.   Neurological: Positive for headaches (infrequent). Negative for dizziness.   Psychiatric/Behavioral: Negative for dysphoric mood. The patient is not nervous/anxious.    All other systems reviewed and are negative.     Current Outpatient Medications on File Prior to Visit   Medication Sig Dispense Refill   ??? Cholecalciferol (VITAMIN D) 50 MCG (2000 UT) CAPS capsule      ??? Multiple Vitamins-Minerals (MULTIVITAMIN ADULT PO) Take by mouth       No current facility-administered medications on file prior to visit.       No Known Allergies    Past Medical History:   Diagnosis Date   ??? Chronic knee pain    ??? COVID-19 03/2019    resolved as of 06-16-19    ??? History of toxoplasmosis    ??? History of varicocele    ??? Infertility male    ??? Osgood-Schlatter's disease      Past  Surgical History:   Procedure Laterality Date   ??? KNEE ARTHROSCOPY Right    ??? KNEE ARTHROSCOPY Left 06/22/2019    LEFT KNEE ARTHROSCOPY performed by Coralee Rud, MD at [facility] OR   ??? NECK SURGERY     ??? TESTICLE SURGERY Left    ??? WISDOM TOOTH EXTRACTION       Family History   Problem Relation Age of Onset   ??? High Blood Pressure Mother    ??? Diabetes Mother    ??? Migraines Mother    ??? Other Father         thyroid diorder   ??? Cancer Father         CLL, lymphoma, melanoma    ??? Prostate Cancer Father    ??? Elevated Lipids Father    ??? Hypertension Father      Social History     Socioeconomic History   ??? Marital status: Married     Spouse name: Not on file   ??? Number of children: Not on file   ??? Years of education: Not on file   ??? Highest education level: Not on file   Occupational History   ??? Not on file   Tobacco Use   ???  Smoking status: Never Smoker   ??? Smokeless tobacco: Never Used   Substance and Sexual Activity   ??? Alcohol use: Yes     Comment: occas   ??? Drug use: No   ??? Sexual activity: Not on file   Other Topics Concern   ??? Not on file   Social History Narrative   ??? Not on file     Social Determinants of Health     Financial Resource Strain:    ??? Difficulty of Paying Living Expenses: Not on file   Food Insecurity:    ??? Worried About Running Out of Food in the Last Year: Not on file   ??? Ran Out of Food in the Last Year: Not on file   Transportation Needs:    ??? Lack of Transportation (Medical): Not on file   ??? Lack of Transportation (Non-Medical): Not on file   Physical Activity:    ??? Days of Exercise per Week: Not on file   ??? Minutes of Exercise per Session: Not on file   Stress:    ??? Feeling of Stress : Not on file   Social Connections:    ??? Frequency of Communication with Friends and Family: Not on file   ??? Frequency of Social Gatherings with Friends and Family: Not on file   ??? Attends Religious Services: Not on file   ??? Active Member of Clubs or Organizations: Not on file   ??? Attends Banker Meetings:  Not on file   ??? Marital Status: Not on file   Intimate Partner Violence:    ??? Fear of Current or Ex-Partner: Not on file   ??? Emotionally Abused: Not on file   ??? Physically Abused: Not on file   ??? Sexually Abused: Not on file   Housing Stability:    ??? Unable to Pay for Housing in the Last Year: Not on file   ??? Number of Places Lived in the Last Year: Not on file   ??? Unstable Housing in the Last Year: Not on file       Vitals:    08/29/20 1415   BP: 122/80   Pulse: 75   SpO2: 99%   Weight: 230 lb (104.3 kg)   Height: 5\' 8"  (1.727 m)       Physical Exam:  Physical Exam  Vitals and nursing note reviewed.   Constitutional:       General: He is not in acute distress.     Appearance: Normal appearance. He is well-developed and normal weight. He is not ill-appearing.   HENT:      Head: Normocephalic and atraumatic.      Right Ear: Hearing and external ear normal.      Left Ear: Hearing and external ear normal.      Nose:      Comments: Wearing mask  Eyes:      General: Lids are normal. No scleral icterus.     Extraocular Movements: Extraocular movements intact.      Conjunctiva/sclera: Conjunctivae normal.   Neck:      Thyroid: No thyromegaly.   Cardiovascular:      Rate and Rhythm: Normal rate and regular rhythm.      Heart sounds: Normal heart sounds. No murmur heard.      Pulmonary:      Effort: Pulmonary effort is normal. No respiratory distress.      Breath sounds: Normal breath sounds. No wheezing.   Musculoskeletal:  General: No tenderness or deformity. Normal range of motion.      Cervical back: Normal range of motion and neck supple.      Right lower leg: No edema.      Left lower leg: No edema.   Lymphadenopathy:      Cervical: No cervical adenopathy.   Skin:     General: Skin is warm and dry.      Findings: No rash.   Neurological:      General: No focal deficit present.      Mental Status: He is alert and oriented to person, place, and time.      Gait: Gait normal.   Psychiatric:         Mood and Affect:  Mood and affect normal.         Speech: Speech normal.         Behavior: Behavior normal.         Thought Content: Thought content normal.         Labs:  CBC with Differential:    Lab Results   Component Value Date    WBC 4.7 06/15/2019    RBC 5.07 06/15/2019    HGB 15.6 06/15/2019    HCT 45.2 06/15/2019    PLT 204 06/15/2019    MCV 89.2 06/15/2019    MCH 30.8 06/15/2019    MCHC 34.5 06/15/2019    RDW 12.2 06/15/2019    SEGSPCT 53 01/24/2011    LYMPHOPCT 50.8 06/15/2019    MONOPCT 10.2 06/15/2019    BASOPCT 1.3 06/15/2019    MONOSABS 0.48 06/15/2019    LYMPHSABS 2.40 06/15/2019    EOSABS 0.11 06/15/2019    BASOSABS 0.06 06/15/2019     CMP:    Lab Results   Component Value Date    NA 139 06/15/2019    K 4.6 06/15/2019    CL 103 06/15/2019    CO2 29 06/15/2019    BUN 18 06/15/2019    CREATININE 1.0 06/15/2019    CREATININE 1.0 06/30/2016    GFRAA >60 06/15/2019    LABGLOM >60 06/15/2019    GLUCOSE 94 06/15/2019    PROT 7.2 06/15/2019    LABALBU 4.4 06/15/2019    CALCIUM 9.7 06/15/2019    BILITOT 0.3 06/15/2019    ALKPHOS 52 06/15/2019    AST 23 06/15/2019    ALT 29 06/15/2019     U/A:  No results found for: NITRITE, COLORU, PROTEINU, PHUR, LABCAST, WBCUA, RBCUA, MUCUS, TRICHOMONAS, YEAST, BACTERIA, CLARITYU, SPECGRAV, LEUKOCYTESUR, UROBILINOGEN, BILIRUBINUR, BLOODU, GLUCOSEU, AMORPHOUS  HgBA1c:    Lab Results   Component Value Date    LABA1C 4.9 06/30/2016     FLP:    Lab Results   Component Value Date    TRIG 62 06/15/2019    HDL 55 06/15/2019    LDLCALC 119 06/15/2019    LABVLDL 12 06/15/2019     TSH:    Lab Results   Component Value Date    TSH 1.390 06/15/2019        Assessment and Plan:  Kyle Noble was seen today for annual exam.    Diagnoses and all orders for this visit:    Encounter for well adult exam without abnormal findings  -     CBC with Auto Differential; Future  -     Comprehensive Metabolic Panel; Future  -     Lipid Panel; Future  -     TSH; Future  -  Vitamin D 25 Hydroxy; Future  -     Urinalysis;  Future  Healthy 35 yo male.  UTD on HM.  Due for yearly labs.     Chronic migraine without aura without status migrainosus, not intractable  -     SUMAtriptan (IMITREX) 25 MG tablet; Take 1 tablet by mouth once as needed for Migraine  Well controlled.  Rarely has a migraine.     Screening for cardiovascular condition  -     Lipid Panel; Future    Vitamin D insufficiency  -     Vitamin D 25 Hydroxy; Future          Return in about 1 year (around 08/29/2021), or if symptoms worsen or fail to improve, for Well visit.      Seen By:  Garner Nash, DO

## 2020-09-09 ENCOUNTER — Encounter

## 2020-09-09 LAB — URINALYSIS
Bilirubin Urine: NEGATIVE
Blood, Urine: NEGATIVE
Glucose, Ur: NEGATIVE mg/dL
Ketones, Urine: NEGATIVE mg/dL
Leukocyte Esterase, Urine: NEGATIVE
Nitrite, Urine: NEGATIVE
Protein, UA: NEGATIVE mg/dL
Specific Gravity, UA: 1.02 (ref 1.005–1.030)
Urobilinogen, Urine: 0.2 E.U./dL (ref <2.0)
pH, UA: 8.5 (ref 5.0–9.0)

## 2020-09-09 LAB — COMPREHENSIVE METABOLIC PANEL
ALT: 38 U/L (ref 0–40)
AST: 37 U/L (ref 0–39)
Albumin: 4.2 g/dL (ref 3.5–5.2)
Alkaline Phosphatase: 63 U/L (ref 40–129)
Anion Gap: 10 mmol/L (ref 7–16)
BUN: 26 mg/dL — ABNORMAL HIGH (ref 6–20)
CO2: 26 mmol/L (ref 22–29)
Calcium: 9.3 mg/dL (ref 8.6–10.2)
Chloride: 107 mmol/L (ref 98–107)
Creatinine: 1.2 mg/dL (ref 0.7–1.2)
GFR African American: >60
GFR Non-African American: >60 mL/min/{1.73_m2} (ref >=60)
Glucose: 103 mg/dL — ABNORMAL HIGH (ref 74–99)
Potassium: 4.6 mmol/L (ref 3.5–5.0)
Sodium: 143 mmol/L (ref 132–146)
Total Bilirubin: 0.5 mg/dL (ref 0.0–1.2)
Total Protein: 6.8 g/dL (ref 6.4–8.3)

## 2020-09-09 LAB — CBC WITH AUTO DIFFERENTIAL
Basophils %: 1.1 % (ref 0.0–2.0)
Basophils Absolute: 0.06 E9/L (ref 0.00–0.20)
Eosinophils %: 1.7 % (ref 0.0–6.0)
Eosinophils Absolute: 0.09 E9/L (ref 0.05–0.50)
Hematocrit: 43.8 % (ref 37.0–54.0)
Hemoglobin: 14.7 g/dL (ref 12.5–16.5)
Immature Granulocytes #: 0.01 E9/L
Immature Granulocytes %: 0.2 % (ref 0.0–5.0)
Lymphocytes %: 42.1 % — ABNORMAL HIGH (ref 20.0–42.0)
Lymphocytes Absolute: 2.24 E9/L (ref 1.50–4.00)
MCH: 29.9 pg (ref 26.0–35.0)
MCHC: 33.6 % (ref 32.0–34.5)
MCV: 89.2 fL (ref 80.0–99.9)
MPV: 10.7 fL (ref 7.0–12.0)
Monocytes %: 10.7 % (ref 2.0–12.0)
Monocytes Absolute: 0.57 E9/L (ref 0.10–0.95)
Neutrophils %: 44.2 % (ref 43.0–80.0)
Neutrophils Absolute: 2.35 E9/L (ref 1.80–7.30)
Platelets: 226 E9/L (ref 130–450)
RBC: 4.91 E12/L (ref 3.80–5.80)
RDW: 11.8 fL (ref 11.5–15.0)
WBC: 5.3 E9/L (ref 4.5–11.5)

## 2020-09-09 LAB — LIPID PANEL
Cholesterol, Total: 176 mg/dL (ref 0–199)
HDL: 53 mg/dL (ref >40)
LDL Calculated: 109 mg/dL — ABNORMAL HIGH (ref 0–99)
Triglycerides: 71 mg/dL (ref 0–149)
VLDL Cholesterol Calculated: 14 mg/dL

## 2020-09-09 LAB — TSH: TSH: 1.41 u[IU]/mL (ref 0.270–4.200)

## 2020-09-09 LAB — VITAMIN D 25 HYDROXY: Vit D, 25-Hydroxy: 43 ng/mL (ref 30–100)

## 2020-09-27 ENCOUNTER — Ambulatory Visit: Admit: 2020-09-27 | Payer: BLUE CROSS/BLUE SHIELD | Attending: Physician Assistant | Primary: Family Medicine

## 2020-09-27 ENCOUNTER — Ambulatory Visit: Admit: 2020-09-27 | Payer: BLUE CROSS/BLUE SHIELD | Primary: Family Medicine

## 2020-09-27 DIAGNOSIS — R059 Cough, unspecified: Secondary | ICD-10-CM

## 2020-09-27 DIAGNOSIS — J069 Acute upper respiratory infection, unspecified: Secondary | ICD-10-CM

## 2020-09-27 LAB — POCT COVID-19, ANTIGEN
Lot Number: 1358047
SARS-COV-2, POC: NOT DETECTED

## 2020-09-27 MED ORDER — BENZONATATE 100 MG PO CAPS
100 MG | ORAL_CAPSULE | Freq: Three times a day (TID) | ORAL | 0 refills | Status: AC | PRN
Start: 2020-09-27 — End: 2020-10-04

## 2020-09-27 MED ORDER — PREDNISONE 10 MG PO TABS
10 | ORAL_TABLET | ORAL | 0 refills | Status: DC
Start: 2020-09-27 — End: 2021-09-22

## 2020-09-27 MED ORDER — DOXYCYCLINE HYCLATE 100 MG PO TABS
100 MG | ORAL_TABLET | Freq: Two times a day (BID) | ORAL | 0 refills | Status: AC
Start: 2020-09-27 — End: 2020-10-07

## 2020-09-27 MED ORDER — ALBUTEROL SULFATE HFA 108 (90 BASE) MCG/ACT IN AERS
108 (90 Base) MCG/ACT | Freq: Four times a day (QID) | RESPIRATORY_TRACT | 0 refills | Status: AC | PRN
Start: 2020-09-27 — End: 2021-11-07

## 2020-09-27 NOTE — Progress Notes (Signed)
09/27/20  Kyle Noble DOB: [birth year] Sex: male  Age 35 y.o.      Subjective:  Chief Complaint   Patient presents with   ??? Congestion     On last dose of antibiotic   ??? Cough   ??? Pharyngitis   ??? Fatigue         HPI:   Kyle Noble , 35 y.o. male presents to express care for evaluation of cough, congestion, drainage, shortness of breath    HPI  35 year old male presents to [facility] for evaluation of cough, congestion, drainage, fatigue and shortness of breath.  The patient has had the symptoms ongoing for about 2 weeks now.  The patient is not having any fevers that he is detected.  The patient is on Augmentin.  The patient notes that he does have some shortness of breath associated.  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  He has had COVID in the past.  He has had the vaccine and 1 booster.      ROS:   Unless otherwise stated in this report the patient's positive and negative responses for review of systems for constitutional, eyes, ENT, cardiovascular, respiratory, gastrointestinal, neurological, GU, musculoskeletal, and integument systems and related systems to the presenting problem are either stated in the history of present illness or were not pertinent or were negative for the symptoms and/or complaints related to the presenting medical problem.  Positives and pertinent negatives as per HPI.  All others reviewed and are negative.      PMH:     Past Medical History:   Diagnosis Date   ??? Chronic knee pain    ??? COVID-19 03/2019    resolved as of 06-16-19    ??? History of toxoplasmosis    ??? History of varicocele    ??? Infertility male    ??? Osgood-Schlatter's disease        Past Surgical History:   Procedure Laterality Date   ??? KNEE ARTHROSCOPY Right    ??? KNEE ARTHROSCOPY Left 06/22/2019    LEFT KNEE ARTHROSCOPY performed by Coralee Rud, MD at [facility] OR   ??? NECK SURGERY     ??? TESTICLE SURGERY Left    ??? WISDOM TOOTH EXTRACTION         Family History    Problem Relation Age of Onset   ??? High Blood Pressure Mother    ??? Diabetes Mother    ??? Migraines Mother    ??? Other Father         thyroid diorder   ??? Cancer Father         CLL, lymphoma, melanoma    ??? Prostate Cancer Father    ??? Elevated Lipids Father    ??? Hypertension Father        Medications:     Current Outpatient Medications:   ???  doxycycline hyclate (VIBRA-TABS) 100 MG tablet, Take 1 tablet by mouth 2 times daily for 10 days, Disp: 20 tablet, Rfl: 0  ???  predniSONE (DELTASONE) 10 MG tablet, 3 tabs once daily for 3 days, 2 tabs once daily for 3 days, 1 tab once daily for 3 days, Disp: 18 tablet, Rfl: 0  ???  benzonatate (TESSALON) 100 MG capsule, Take 1 capsule by mouth 3 times daily as needed for Cough, Disp: 21 capsule, Rfl: 0  ???  albuterol sulfate HFA 108 (90 Base) MCG/ACT inhaler, Inhale 2 puffs into the lungs  4 times daily as needed for Wheezing, Disp: 18 g, Rfl: 0  ???  amoxicillin-clavulanate (AUGMENTIN) 875-125 MG per tablet, TAKE ONE TABLET BY MOUTH TWICE A DAY FOR 10 DAYS, Disp: , Rfl:   ???  Cholecalciferol (VITAMIN D) 50 MCG (2000 UT) CAPS capsule, , Disp: , Rfl:   ???  SUMAtriptan (IMITREX) 25 MG tablet, Take 1 tablet by mouth once as needed for Migraine, Disp: 9 tablet, Rfl: 3  ???  Multiple Vitamins-Minerals (MULTIVITAMIN ADULT PO), Take by mouth, Disp: , Rfl:     Allergies:   No Known Allergies    Social History:     Social History     Tobacco Use   ??? Smoking status: Never Smoker   ??? Smokeless tobacco: Never Used   Substance Use Topics   ??? Alcohol use: Yes     Comment: occas   ??? Drug use: No       Patient lives at home.    Physical Exam:     Vitals:    09/27/20 0835   BP: 110/70   Site: Right Upper Arm   Position: Sitting   Cuff Size: Large Adult   Pulse: (!) 104   Resp: 18   Temp: 98.7 ??F (37.1 ??C)   TempSrc: Temporal   SpO2: 98%   Weight: 230 lb (104.3 kg)   Height: 5\' 8"  (1.727 m)       Exam:  Physical Exam  Nurse's notes and vital signs reviewed. The patient is not hypoxic.  ?  General: Alert, no  acute distress, patient resting comfortably Patient is not toxic or lethargic.  Skin: Warm, intact, no pallor noted. There is no evidence of rash at this time.  Head: Normocephalic, atraumatic  Eye: Normal conjunctiva  Ears, Nose, Throat: Right tympanic membrane clear, left tympanic membrane clear. No drainage or discharge noted. No pre- or post-auricular tenderness, erythema, or swelling noted.   Nasal congestion, rhinorrhea, no epistaxis  Posterior oropharynx shows erythema and cobblestoning but no evidence of tonsillar hypertrophy, or exudate. the uvula is midline. No trismus or drooling is noted.   Moist mucous membranes.  Cardiovascular: Regular Rate and Rhythm  Respiratory: No acute distress, no rhonchi, wheezing or crackles noted. No stridor or retractions are noted.  Neurological: A&O x4, normal speech  Psychiatric: Cooperative         Testing:     Results for orders placed or performed in visit on 09/27/20   POCT COVID-19, Antigen   Result Value Ref Range    SARS-COV-2, POC Not-Detected Not Detected    Lot Number 09/29/20     QC Pass/Fail Pass     Performing Instrument BD Veritor      XR CHEST STANDARD (2 VW)    Result Date: 09/27/2020  EXAMINATION: TWO XRAY VIEWS OF THE CHEST 09/27/2020 9:08 am COMPARISON: None. HISTORY: ORDERING SYSTEM PROVIDED HISTORY: Cough TECHNOLOGIST PROVIDED HISTORY: Reason for exam:->cough FINDINGS: PA and left lateral views of the chest demonstrate satisfactory expansion lungs which are clear.  The cardiac silhouette appears unremarkable.  The soft tissues and osseous structures appear unremarkable.     No acute cardiopulmonary process.         Medical Decision Making:     Vital signs reviewed    Past medical history reviewed.    Allergies reviewed.    Medications reviewed.    Patient on arrival does not appear to be in any apparent distress or discomfort.  The patient has been seen and evaluated.  The patient does not appear to be toxic or lethargic.     The patient was sent for  chest x-ray and we obtained a COVID swab.  The patient had a COVID test that was negative.    Chest x-ray did not show any evidence of acute cardiopulmonary process.    We will switch the patient to doxycycline, albuterol, Tessalon and a tapering dose of prednisone.    The patient was educated on the proper dosage of motrin and tylenol and the appropriate intervals of each. The patient is to increase fluid intake over the next several days. The patient is to use OTC decongestant as needed.     The patient is to return to express care or go directly to the emergency department should any of the signs or symptoms worsen. The patient is to followup with primary care physician in 2-3 days for repeat evaluation. The patient has no other questions or concerns at this time the patient will be discharged home.      Clinical Impression:   Jarick was seen today for congestion, cough, pharyngitis and fatigue.    Diagnoses and all orders for this visit:    Acute upper respiratory infection, unspecified    Cough  -     POCT COVID-19, Antigen  -     XR CHEST STANDARD (2 VW); Future    Acute non-recurrent sinusitis, unspecified location    Nasal congestion    Other orders  -     doxycycline hyclate (VIBRA-TABS) 100 MG tablet; Take 1 tablet by mouth 2 times daily for 10 days  -     predniSONE (DELTASONE) 10 MG tablet; 3 tabs once daily for 3 days, 2 tabs once daily for 3 days, 1 tab once daily for 3 days  -     benzonatate (TESSALON) 100 MG capsule; Take 1 capsule by mouth 3 times daily as needed for Cough  -     albuterol sulfate HFA 108 (90 Base) MCG/ACT inhaler; Inhale 2 puffs into the lungs 4 times daily as needed for Wheezing        The patient is to call for any concerns or return if any of the signs or symptoms worsen. The patient is to follow-up with PCP in the next 2-3 days for repeat evaluation repeat assessment or go directly to the emergency department.     SIGNATURE: Alben Spittle III, PA-C

## 2020-09-27 NOTE — Other (Signed)
Discussed with the patient here in the office

## 2021-09-05 ENCOUNTER — Encounter: Attending: Family Medicine | Primary: Family Medicine

## 2021-09-22 ENCOUNTER — Ambulatory Visit
Admit: 2021-09-22 | Discharge: 2021-09-22 | Payer: BLUE CROSS/BLUE SHIELD | Attending: Physician Assistant | Primary: Family Medicine

## 2021-09-22 DIAGNOSIS — J019 Acute sinusitis, unspecified: Secondary | ICD-10-CM

## 2021-09-22 LAB — POCT COVID-19, ANTIGEN
Lot Number: 2236820
SARS-COV-2, POC: NOT DETECTED

## 2021-09-22 MED ORDER — AMOXICILLIN-POT CLAVULANATE 875-125 MG PO TABS
875-125 MG | ORAL_TABLET | Freq: Two times a day (BID) | ORAL | 0 refills | Status: AC
Start: 2021-09-22 — End: 2021-10-02

## 2021-09-22 MED ORDER — PREDNISONE 10 MG PO TABS
10 | ORAL_TABLET | ORAL | 0 refills | Status: DC
Start: 2021-09-22 — End: 2021-11-07

## 2021-09-22 NOTE — Progress Notes (Signed)
09/22/21  Kyle Noble DOB: 01/12/1986 Sex: male  Age 36 y.o.      Subjective:  Chief Complaint   Patient presents with    Sinusitis     Pt states that allergies have been acting up for a month now and thinks its now turned into a sinus infection.         HPI:   Kyle Noble , 36 y.o. male presents to express care for evaluation of sinus congestion    HPI  36 year old male presents to express care for evaluation of sinus congestion, drainage.  The patient said the symptoms ongoing for about a month with the allergies but in the last day the patient did note some fevers.  The patient has been using over-the-counter antihistamine.  The patient has also been using nasal spray.  The patient has not had much relief.  The patient's not any chest pain, shortness of breath.  Does have a little bit of a cough.      ROS:   Unless otherwise stated in this report the patient's positive and negative responses for review of systems for constitutional, eyes, ENT, cardiovascular, respiratory, gastrointestinal, neurological, GU, musculoskeletal, and integument systems and related systems to the presenting problem are either stated in the history of present illness or were not pertinent or were negative for the symptoms and/or complaints related to the presenting medical problem.  Positives and pertinent negatives as per HPI.  All others reviewed and are negative.      PMH:     Past Medical History:   Diagnosis Date    Chronic knee pain     COVID-19 03/2019    resolved as of 06-16-19     History of toxoplasmosis     History of varicocele     Infertility male     Osgood-Schlatter's disease        Past Surgical History:   Procedure Laterality Date    KNEE ARTHROSCOPY Right     KNEE ARTHROSCOPY Left 06/22/2019    LEFT KNEE ARTHROSCOPY performed by Coralee Rudhomas S Boniface, MD at [facility] OR    NECK SURGERY      TESTICLE SURGERY Left     WISDOM TOOTH EXTRACTION         Family History   Problem Relation Age of Onset    High Blood Pressure  Mother     Diabetes Mother     Migraines Mother     Other Father         thyroid diorder    Cancer Father         CLL, lymphoma, melanoma     Prostate Cancer Father     Elevated Lipids Father     Hypertension Father        Medications:     Current Outpatient Medications:     amoxicillin-clavulanate (AUGMENTIN) 875-125 MG per tablet, Take 1 tablet by mouth 2 times daily for 10 days, Disp: 20 tablet, Rfl: 0    predniSONE (DELTASONE) 10 MG tablet, 3 tabs once daily for 3 days, 2 tabs once daily for 3 days, 1 tab once daily for 3 days, Disp: 18 tablet, Rfl: 0    albuterol sulfate HFA 108 (90 Base) MCG/ACT inhaler, Inhale 2 puffs into the lungs 4 times daily as needed for Wheezing, Disp: 18 g, Rfl: 0    Cholecalciferol (VITAMIN D) 50 MCG (2000 UT) CAPS capsule, , Disp: , Rfl:     Multiple Vitamins-Minerals (MULTIVITAMIN ADULT PO), Take  by mouth, Disp: , Rfl:     SUMAtriptan (IMITREX) 25 MG tablet, Take 1 tablet by mouth once as needed for Migraine, Disp: 9 tablet, Rfl: 3    Allergies:   No Known Allergies    Social History:     Social History     Tobacco Use    Smoking status: Never    Smokeless tobacco: Never   Substance Use Topics    Alcohol use: Yes     Comment: occas    Drug use: No       Patient lives at home.    Physical Exam:     Vitals:    09/22/21 0800   BP: 118/70   Pulse: (!) 101   Temp: 98.6 F (37 C)   SpO2: 98%   Weight: 207 lb (93.9 kg)   Height: 5\' 8"  (1.727 m)       Exam:  Physical Exam  Nurse's notes and vital signs reviewed. The patient is not hypoxic.  ?  General: Alert, no acute distress, patient resting comfortably Patient is not toxic or lethargic.  Skin: Warm, intact, no pallor noted. There is no evidence of rash at this time.  Head: Normocephalic, atraumatic  Eye: Normal conjunctiva  Ears, Nose, Throat: Right tympanic membrane clear, left tympanic membrane clear. No drainage or discharge noted. No pre- or post-auricular tenderness, erythema, or swelling noted.   Nasal congestion, rhinorrhea, no  epistaxis  Posterior oropharynx shows erythema but no evidence of tonsillar hypertrophy, or exudate. the uvula is midline. No trismus or drooling is noted.   Moist mucous membranes.  Cardiovascular: Regular Rate and Rhythm  Respiratory: No acute distress, no rhonchi, wheezing or crackles noted. No stridor or retractions are noted.  Neurological: A&O x4, normal speech  Psychiatric: Cooperative       Testing:           Medical Decision Making:     Vital signs reviewed    Past medical history reviewed.    Allergies reviewed.    Medications reviewed.    Patient on arrival does not appear to be in any apparent distress or discomfort.  The patient has been seen and evaluated.  The patient does not appear to be toxic or lethargic.     COVID test was negative    We will treat the patient with Augmentin and prednisone.    The patient was educated on the proper dosage of motrin and tylenol and the appropriate intervals of each. The patient is to increase fluid intake over the next several days. The patient is to use OTC decongestant as needed.     The patient is to return to express care or go directly to the emergency department should any of the signs or symptoms worsen. The patient is to followup with primary care physician in 2-3 days for repeat evaluation. The patient has no other questions or concerns at this time the patient will be discharged home.      Clinical Impression:   Kyle Noble was seen today for sinusitis.    Diagnoses and all orders for this visit:    Acute non-recurrent sinusitis, unspecified location  -     POCT COVID-19, Antigen    Postnasal drip    Nasal congestion    Other orders  -     amoxicillin-clavulanate (AUGMENTIN) 875-125 MG per tablet; Take 1 tablet by mouth 2 times daily for 10 days  -     predniSONE (DELTASONE) 10 MG tablet; 3 tabs  once daily for 3 days, 2 tabs once daily for 3 days, 1 tab once daily for 3 days        The patient is to call for any concerns or return if any of the signs or  symptoms worsen. The patient is to follow-up with PCP in the next 2-3 days for repeat evaluation repeat assessment or go directly to the emergency department.     SIGNATURE: Alben Spittle III, PA-C

## 2021-10-13 LAB — ENTERIC PATHOGEN PANEL
Campylobacter DNA: NOT DETECTED
E Coli Shiga Toxin 1: NOT DETECTED
E Coli Shiga Toxin 2: NOT DETECTED
Norovirus GII RNA, Stool: NOT DETECTED
Rotavirus A PCR: NOT DETECTED
Salmonella DNA: NOT DETECTED
Shigella DNA: NOT DETECTED
Vibrio DNA: NOT DETECTED
Yersinia Enterocolitica PCR: NOT DETECTED

## 2021-11-07 ENCOUNTER — Ambulatory Visit
Admit: 2021-11-07 | Discharge: 2021-11-07 | Payer: BLUE CROSS/BLUE SHIELD | Attending: Family Medicine | Primary: Family Medicine

## 2021-11-07 ENCOUNTER — Encounter

## 2021-11-07 DIAGNOSIS — Z0000 Encounter for general adult medical examination without abnormal findings: Secondary | ICD-10-CM

## 2021-11-07 NOTE — Progress Notes (Signed)
11/07/21  Kyle Noble DOB: 02/09/1986 Sex: male  Age: 36 y.o.    Chief Complaint   Patient presents with    Annual Exam    Diarrhea     Was started on flagyl & vanco on June 22nd and had stool cultures done but they were negative . Bowels still loose but overall improved     HPI:  36 y.o. male presents today for yearly physical.  Patient's chart, medical, surgical and medication history all reviewed.    Well Adult Physical  Patient here for a physical exam.  The patient reports problems- patient has been dealing with loose stools/frequent diarrhea for the last month.  Stool testing negative.  Being treated for C. Diff at this time with some improvement.     Do you take any herbs or supplements that were not prescribed by a doctor? no   Are you taking calcium supplements? no   Are you taking aspirin daily? no    Colonoscopy: No prior colonoscopy  Dental visit: Within last 6 mos  Vision check:  No Problems    Last time routine bloodwork was done:  May 2022    Immunization status: up to date and documented.    Smoking status: never    Physical activity: weight lifting, frequently    ROS:  Review of Systems   Constitutional:  Negative for chills, fatigue and fever.   Respiratory:  Negative for cough, shortness of breath and wheezing.    Cardiovascular:  Negative for chest pain and palpitations.   Gastrointestinal:  Positive for diarrhea. Negative for abdominal pain, constipation, nausea and vomiting.   Musculoskeletal:  Negative for arthralgias and back pain.   Skin:  Negative for rash.   Neurological:  Positive for headaches (infrequent). Negative for dizziness.   Psychiatric/Behavioral:  Negative for dysphoric mood. The patient is not nervous/anxious.    All other systems reviewed and are negative.   Current Outpatient Medications on File Prior to Visit   Medication Sig Dispense Refill    Cholecalciferol (VITAMIN D) 50 MCG (2000 UT) CAPS capsule       Multiple Vitamins-Minerals (MULTIVITAMIN ADULT PO) Take by  mouth       No current facility-administered medications on file prior to visit.       No Known Allergies    Past Medical History:   Diagnosis Date    Chronic knee pain     COVID-19 03/2019    resolved as of 06-16-19     History of toxoplasmosis     History of varicocele     Infertility male     Osgood-Schlatter's disease      Past Surgical History:   Procedure Laterality Date    KNEE ARTHROSCOPY Right     KNEE ARTHROSCOPY Left 06/22/2019    LEFT KNEE ARTHROSCOPY performed by Coralee Rud, MD at [facility] OR    NECK SURGERY      TESTICLE SURGERY Left     WISDOM TOOTH EXTRACTION       Family History   Problem Relation Age of Onset    High Blood Pressure Mother     Diabetes Mother     Migraines Mother     Other Father         thyroid diorder    Cancer Father         CLL, lymphoma, melanoma     Prostate Cancer Father     Elevated Lipids Father     Hypertension Father  Social History     Socioeconomic History    Marital status: Married     Spouse name: Not on file    Number of children: Not on file    Years of education: Not on file    Highest education level: Not on file   Occupational History    Not on file   Tobacco Use    Smoking status: Never    Smokeless tobacco: Never   Substance and Sexual Activity    Alcohol use: Yes     Comment: occas    Drug use: No    Sexual activity: Not on file   Other Topics Concern    Not on file   Social History Narrative    Not on file     Social Determinants of Health     Financial Resource Strain: Low Risk     Difficulty of Paying Living Expenses: Not hard at all   Food Insecurity: No Food Insecurity    Worried About Running Out of Food in the Last Year: Never true    Ran Out of Food in the Last Year: Never true   Transportation Needs: Unknown    Lack of Transportation (Medical): Not on file    Lack of Transportation (Non-Medical): No   Physical Activity: Not on file   Stress: Not on file   Social Connections: Not on file   Intimate Partner Violence: Not on file   Housing  Stability: Unknown    Unable to Pay for Housing in the Last Year: Not on file    Number of Places Lived in the Last Year: Not on file    Unstable Housing in the Last Year: No       Vitals:    11/07/21 1335   BP: 120/82   Pulse: 77   Temp: 97.9 F (36.6 C)   SpO2: 96%   Weight: 202 lb (91.6 kg)   Height: 5\' 8"  (1.727 m)       Physical Exam:  Physical Exam  Vitals and nursing note reviewed.   Constitutional:       General: He is not in acute distress.     Appearance: Normal appearance. He is well-developed and normal weight. He is not ill-appearing.   HENT:      Head: Normocephalic and atraumatic.      Right Ear: Hearing and external ear normal.      Left Ear: Hearing and external ear normal.      Nose: Nose normal.      Mouth/Throat:      Mouth: Mucous membranes are moist.   Eyes:      General: Lids are normal. No scleral icterus.     Extraocular Movements: Extraocular movements intact.      Conjunctiva/sclera: Conjunctivae normal.   Neck:      Thyroid: No thyromegaly.      Vascular: No carotid bruit.   Cardiovascular:      Rate and Rhythm: Normal rate and regular rhythm.      Heart sounds: Normal heart sounds. No murmur heard.  Pulmonary:      Effort: Pulmonary effort is normal. No respiratory distress.      Breath sounds: Normal breath sounds. No wheezing.   Musculoskeletal:         General: No tenderness or deformity. Normal range of motion.      Cervical back: Normal range of motion and neck supple.      Right lower leg: No edema.  Left lower leg: No edema.   Lymphadenopathy:      Cervical: No cervical adenopathy.   Skin:     General: Skin is warm and dry.      Findings: No rash.   Neurological:      General: No focal deficit present.      Mental Status: He is alert and oriented to person, place, and time.      Gait: Gait normal.   Psychiatric:         Mood and Affect: Mood and affect normal.         Speech: Speech normal.         Behavior: Behavior normal.         Thought Content: Thought content normal.        Labs:  CBC with Differential:    Lab Results   Component Value Date/Time    WBC 5.3 09/09/2020 08:29 AM    RBC 4.91 09/09/2020 08:29 AM    HGB 14.7 09/09/2020 08:29 AM    HCT 43.8 09/09/2020 08:29 AM    PLT 226 09/09/2020 08:29 AM    MCV 89.2 09/09/2020 08:29 AM    MCH 29.9 09/09/2020 08:29 AM    MCHC 33.6 09/09/2020 08:29 AM    RDW 11.8 09/09/2020 08:29 AM    SEGSPCT 53 01/24/2011 01:50 PM    LYMPHOPCT 42.1 09/09/2020 08:29 AM    MONOPCT 10.7 09/09/2020 08:29 AM    BASOPCT 1.1 09/09/2020 08:29 AM    MONOSABS 0.57 09/09/2020 08:29 AM    LYMPHSABS 2.24 09/09/2020 08:29 AM    EOSABS 0.09 09/09/2020 08:29 AM    BASOSABS 0.06 09/09/2020 08:29 AM     CMP:    Lab Results   Component Value Date/Time    NA 143 09/09/2020 08:29 AM    K 4.6 09/09/2020 08:29 AM    CL 107 09/09/2020 08:29 AM    CO2 26 09/09/2020 08:29 AM    BUN 26 09/09/2020 08:29 AM    CREATININE 1.2 09/09/2020 08:29 AM    CREATININE 1.0 06/30/2016 12:00 AM    GFRAA >60 09/09/2020 08:29 AM    LABGLOM >60 09/09/2020 08:29 AM    GLUCOSE 103 09/09/2020 08:29 AM    PROT 6.8 09/09/2020 08:29 AM    LABALBU 4.2 09/09/2020 08:29 AM    CALCIUM 9.3 09/09/2020 08:29 AM    BILITOT 0.5 09/09/2020 08:29 AM    ALKPHOS 63 09/09/2020 08:29 AM    AST 37 09/09/2020 08:29 AM    ALT 38 09/09/2020 08:29 AM     U/A:    Lab Results   Component Value Date/Time    COLORU Yellow 09/09/2020 08:28 AM    PROTEINU Negative 09/09/2020 08:28 AM    PHUR 8.5 09/09/2020 08:28 AM    CLARITYU Clear 09/09/2020 08:28 AM    SPECGRAV 1.020 09/09/2020 08:28 AM    LEUKOCYTESUR Negative 09/09/2020 08:28 AM    UROBILINOGEN 0.2 09/09/2020 08:28 AM    BILIRUBINUR Negative 09/09/2020 08:28 AM    BLOODU Negative 09/09/2020 08:28 AM    GLUCOSEU Negative 09/09/2020 08:28 AM     HgBA1c:    Lab Results   Component Value Date/Time    LABA1C 4.9 06/30/2016 12:00 AM     FLP:    Lab Results   Component Value Date/Time    TRIG 71 09/09/2020 08:29 AM    HDL 53 09/09/2020 08:29 AM    LDLCALC 109 09/09/2020 08:29  AM    LABVLDL  14 09/09/2020 08:29 AM     TSH:    Lab Results   Component Value Date/Time    TSH 1.410 09/09/2020 08:29 AM        Assessment and Plan:  Kyle Noble was seen today for annual exam and diarrhea.    Diagnoses and all orders for this visit:    Encounter for well adult exam without abnormal findings  -     CBC with Auto Differential; Future  -     Comprehensive Metabolic Panel; Future  -     Hemoglobin A1C; Future  -     Lipid Panel; Future  -     TSH; Future  -     Vitamin D 25 Hydroxy; Future  -     Urinalysis; Future  -     Lyme Disease Acute Reflexive Panel; Future  Healthy 36 yo male.  UTD on HM.  Due for routine labs.     Diarrhea of presumed infectious origin  -     Lyme Disease Acute Reflexive Panel; Future  Improving.  If not gone, planning colonoscopy.     Tick bite, unspecified site, subsequent encounter  -     Lyme Disease Acute Reflexive Panel; Future    Family history of diabetes mellitus  -     Hemoglobin A1C; Future    Screening for cardiovascular condition  -     Lipid Panel; Future    Vitamin D insufficiency  -     Vitamin D 25 Hydroxy; Future          Return in about 1 year (around 11/08/2022), or if symptoms worsen or fail to improve, for Well visit.      Seen By:  Garner Nash, DO

## 2021-11-08 LAB — COMPREHENSIVE METABOLIC PANEL
ALT: 66 U/L — ABNORMAL HIGH (ref 0–40)
AST: 118 U/L — ABNORMAL HIGH (ref 0–39)
Albumin: 4.3 g/dL (ref 3.5–5.2)
Alkaline Phosphatase: 63 U/L (ref 40–129)
Anion Gap: 12 mmol/L (ref 7–16)
BUN: 20 mg/dL (ref 6–20)
CO2: 25 mmol/L (ref 22–29)
Calcium: 9.5 mg/dL (ref 8.6–10.2)
Chloride: 104 mmol/L (ref 98–107)
Creatinine: 1 mg/dL (ref 0.7–1.2)
Est, Glom Filt Rate: >60 mL/min/{1.73_m2} (ref >=60)
Glucose: 75 mg/dL (ref 74–99)
Potassium: 4.1 mmol/L (ref 3.5–5.0)
Sodium: 141 mmol/L (ref 132–146)
Total Bilirubin: 0.5 mg/dL (ref 0.0–1.2)
Total Protein: 6.7 g/dL (ref 6.4–8.3)

## 2021-11-08 LAB — URINALYSIS
Bilirubin Urine: NEGATIVE
Blood, Urine: NEGATIVE
Glucose, Ur: NEGATIVE mg/dL
Ketones, Urine: NEGATIVE mg/dL
Leukocyte Esterase, Urine: NEGATIVE
Nitrite, Urine: NEGATIVE
Protein, UA: NEGATIVE mg/dL
Specific Gravity, UA: 1.02 (ref 1.005–1.030)
Urobilinogen, Urine: 0.2 E.U./dL (ref <2.0)
pH, UA: 7.5 (ref 5.0–9.0)

## 2021-11-08 LAB — TSH: TSH: 1.17 u[IU]/mL (ref 0.270–4.200)

## 2021-11-08 LAB — CBC WITH AUTO DIFFERENTIAL
Basophils %: 1 % (ref 0.0–2.0)
Basophils Absolute: 0.08 E9/L (ref 0.00–0.20)
Eosinophils %: 1.5 % (ref 0.0–6.0)
Eosinophils Absolute: 0.12 E9/L (ref 0.05–0.50)
Hematocrit: 44.3 % (ref 37.0–54.0)
Hemoglobin: 14.3 g/dL (ref 12.5–16.5)
Immature Granulocytes #: 0.01 E9/L
Immature Granulocytes %: 0.1 % (ref 0.0–5.0)
Lymphocytes %: 32.9 % (ref 20.0–42.0)
Lymphocytes Absolute: 2.57 E9/L (ref 1.50–4.00)
MCH: 30 pg (ref 26.0–35.0)
MCHC: 32.3 % (ref 32.0–34.5)
MCV: 92.9 fL (ref 80.0–99.9)
MPV: 10.9 fL (ref 7.0–12.0)
Monocytes %: 9.2 % (ref 2.0–12.0)
Monocytes Absolute: 0.72 E9/L (ref 0.10–0.95)
Neutrophils %: 55.3 % (ref 43.0–80.0)
Neutrophils Absolute: 4.32 E9/L (ref 1.80–7.30)
Platelets: 243 E9/L (ref 130–450)
RBC: 4.77 E12/L (ref 3.80–5.80)
RDW: 13.2 fL (ref 11.5–15.0)
WBC: 7.8 E9/L (ref 4.5–11.5)

## 2021-11-08 LAB — VITAMIN D 25 HYDROXY: Vit D, 25-Hydroxy: 53 ng/mL (ref 30–100)

## 2021-11-08 LAB — LIPID PANEL
Cholesterol, Total: 162 mg/dL (ref 0–199)
HDL: 61 mg/dL (ref >40)
LDL Calculated: 89 mg/dL (ref 0–99)
Triglycerides: 58 mg/dL (ref 0–149)
VLDL Cholesterol Calculated: 12 mg/dL

## 2021-11-08 LAB — HEMOGLOBIN A1C: Hemoglobin A1C: 5.5 % (ref 4.0–5.6)

## 2021-11-09 LAB — COMPREHENSIVE METABOLIC PANEL
ALT: 64 U/L — ABNORMAL HIGH (ref 10–63)
AST: 66 U/L — ABNORMAL HIGH (ref 10–41)
Albumin/Globulin Ratio: 1.6 ratio (ref 1.1–2.2)
Albumin: 4.4 g/dL (ref 3.4–4.8)
Alkaline Phosphatase: 56 U/L (ref 42–121)
Anion Gap: 6 mEq/L (ref 3–11)
BUN: 20 mg/dL (ref 6–20)
CO2: 28 mEq/L (ref 21–31)
Calcium: 9.8 mg/dL (ref 8.5–10.5)
Chloride: 104 mEq/L (ref 98–107)
Creatinine + eGFR Panel: 102 mL/min (ref >60)
Creatinine: 1 mg/dL (ref 0.6–1.3)
GFR Non-African American: 85 mL/min (ref >60)
Globulin: 2.8 g/dL (ref 1.9–3.9)
Glucose: 89 mg/dL (ref 70–99)
Potassium: 3.9 mEq/L (ref 3.6–5.0)
Sodium: 138 mEq/L (ref 135–145)
Total Bilirubin: 0.9 mg/dL (ref 0.3–1.5)
Total Protein: 7.2 g/dL (ref 5.9–7.8)

## 2021-11-09 LAB — CK-MB: CK-MB: 3.4 ng/mL (ref 0–9.0)

## 2021-11-10 LAB — HEPATITIS PANEL, ACUTE
Hep A IgM: NEGATIVE
Hep B Core Ab, IgM: NEGATIVE
Hepatitis B Surface Ag: NEGATIVE
Hepatitis C Ab: NONREACTIVE

## 2021-11-10 LAB — HEPATITIS C ANTIBODY: Hepatitis C Ab: NONREACTIVE

## 2021-11-12 LAB — MISCELLANEOUS SENDOUT

## 2021-12-01 LAB — COMPREHENSIVE METABOLIC PANEL
ALT: 20 U/L (ref 10–63)
AST: 18 U/L (ref 10–41)
Albumin/Globulin Ratio: 1.1 ratio (ref 1.1–2.2)
Albumin: 3.6 g/dL (ref 3.4–4.8)
Alkaline Phosphatase: 54 U/L (ref 42–121)
Anion Gap: 4 mEq/L (ref 3–11)
BUN: 13 mg/dL (ref 6–20)
CO2: 32 mEq/L — ABNORMAL HIGH (ref 21–31)
Calcium: 9.4 mg/dL (ref 8.5–10.5)
Chloride: 103 mEq/L (ref 98–107)
Creatinine + eGFR Panel: 92 mL/min (ref >60)
Creatinine: 1.1 mg/dL (ref 0.6–1.3)
GFR Non-African American: 76 mL/min (ref >60)
Globulin: 3.4 g/dL (ref 1.9–3.9)
Glucose: 90 mg/dL (ref 70–99)
Potassium: 4.3 mEq/L (ref 3.6–5.0)
Sodium: 139 mEq/L (ref 135–145)
Total Bilirubin: 0.5 mg/dL (ref 0.3–1.5)
Total Protein: 7 g/dL (ref 5.9–7.8)

## 2021-12-01 LAB — CBC WITH AUTO DIFFERENTIAL
Basophils %: 0.8 % (ref 0.0–1.5)
Basophils Absolute: 0 10*3/uL (ref 0.00–0.20)
Eosinophils %: 1.1 % (ref 0.0–3.0)
Eosinophils Absolute: 0.1 10*3/uL (ref 0.00–0.33)
Hematocrit: 44.5 % (ref 41.0–50.0)
Hemoglobin: 15 g/dL (ref 13.5–16.5)
Lymphocytes %: 26.8 % (ref 24.0–44.0)
Lymphocytes Absolute: 1.6 10*3/uL (ref 1.10–4.80)
MCH: 29.8 pg (ref 28.0–34.0)
MCHC: 33.6 g/dL (ref 33.0–37.0)
MCV: 88.7 fL (ref 80.0–100.0)
MPV: 8.4 fL (ref 7.4–10.4)
Monocytes %: 16.1 % — ABNORMAL HIGH (ref 3.4–9.0)
Monocytes Absolute: 1 10*3/uL — ABNORMAL HIGH (ref 0.20–0.70)
Neutrophils Absolute: 3.3 10*3/uL (ref 1.83–8.70)
Platelets: 227 10*3/uL (ref 150–450)
RBC: 5.02 M/uL (ref 4.50–5.50)
RDW: 13.3 % (ref 10.9–14.3)
Segs Relative: 55.2 % (ref 40.0–74.0)

## 2021-12-01 LAB — SEDIMENTATION RATE: Sed Rate: 28 mm/hr — ABNORMAL HIGH (ref 0–15)

## 2021-12-01 LAB — GAMMA GT: GGT: 22 U/L (ref 7–70)

## 2021-12-01 LAB — C-REACTIVE PROTEIN: CRP: 13 mg/L — ABNORMAL HIGH (ref <10.0)

## 2021-12-01 LAB — FERRITIN: Ferritin: 209.1 ng/mL (ref 24–336)

## 2021-12-02 LAB — ACTIN ANTIBODY, IGG: Actin Antibody, IgG: 41 Units — AB (ref 0–19)

## 2021-12-02 LAB — MITOCHONDRIAL ANTIBODIES, M2, IGG: Mitochondrial M2 Ab, IgG: <20 Units (ref 0.0–20.0)

## 2021-12-02 LAB — ALPHA-1-ANTITRYPSIN: A-1 Antitrypsin: 201 mg/dL — AB (ref 95–164)

## 2021-12-02 LAB — CERULOPLASMIN: Ceruloplasmin: 25.5 mg/dL (ref 16.0–31.0)

## 2021-12-04 LAB — ANA SCREEN WITH REFLEX: ANA: NEGATIVE

## 2021-12-05 LAB — IMMUNOGLOBULINS PANEL
IgA, Serum: 236 mg/dL (ref 90–386)
IgG, Serum: 977 mg/dL (ref 603–1613)
IgM,Serum: 249 mg/dL — AB (ref 20–172)

## 2021-12-06 LAB — COPPER, SERUM: Copper: 103 ug/dL (ref 69–132)

## 2021-12-07 LAB — IGE: Immunoglobulin E: 52 IU/mL (ref 6–495)

## 2021-12-09 LAB — EBV DNA QL, PCR: EBV DNA, Ql, PCR: POSITIVE — AB

## 2022-01-05 LAB — COMPREHENSIVE METABOLIC PANEL
ALT: 27 U/L (ref 10–63)
AST: 25 U/L (ref 10–41)
Albumin/Globulin Ratio: 1.6 ratio (ref 1.1–2.2)
Albumin: 4.4 g/dL (ref 3.4–4.8)
Alkaline Phosphatase: 52 U/L (ref 42–121)
Anion Gap: 8 mEq/L (ref 3–11)
BUN: 25 mg/dL — ABNORMAL HIGH (ref 6–20)
CO2: 26 mEq/L (ref 21–31)
Calcium: 9.7 mg/dL (ref 8.5–10.5)
Chloride: 108 mEq/L — ABNORMAL HIGH (ref 98–107)
Creatinine + eGFR Panel: 102 mL/min (ref >60)
Creatinine: 1 mg/dL (ref 0.6–1.3)
GFR Non-African American: 85 mL/min (ref >60)
Globulin: 2.8 g/dL (ref 1.9–3.9)
Glucose: 89 mg/dL (ref 70–99)
Potassium: 4.7 mEq/L (ref 3.6–5.0)
Sodium: 142 mEq/L (ref 135–145)
Total Bilirubin: 0.8 mg/dL (ref 0.3–1.5)
Total Protein: 7.2 g/dL (ref 5.9–7.8)

## 2022-11-12 ENCOUNTER — Encounter: Payer: BLUE CROSS/BLUE SHIELD | Attending: Family Medicine | Primary: Family Medicine

## 2023-01-29 ENCOUNTER — Ambulatory Visit
Admit: 2023-01-29 | Discharge: 2023-01-29 | Payer: PRIVATE HEALTH INSURANCE | Attending: Family Medicine | Primary: Family Medicine

## 2023-01-29 VITALS — BP 100/64 | HR 76 | Temp 98.30000°F | Ht 68.0 in | Wt 203.0 lb

## 2023-01-29 DIAGNOSIS — Z0000 Encounter for general adult medical examination without abnormal findings: Secondary | ICD-10-CM

## 2023-01-29 NOTE — Progress Notes (Signed)
 01/29/23  Kyle Noble DOB: 1986-02-27 Sex: male  Age: 37 y.o.    Chief Complaint   Patient presents with    Annual Exam     HPI:  36 y.o. male presents today for yearly physical.  Patient's chart, medical, surgical and medication history all reviewed.    Well Adult Physical  Patient here for a physical exam.  The patient reports no problems.    Do you take any herbs or supplements that were not prescribed by a doctor? no   Are you taking calcium supplements? no   Are you taking aspirin daily? no    Colonoscopy: No colonoscopy on file   No cologuard on file  No FIT/FOBT on file   No flexible sigmoidoscopy on file  Dental visit: Within last 6 mos  Vision check:  No Problems    Last time routine bloodwork was done:  July 2023    Immunization status: up to date and documented.    Smoking status: never    Physical activity: weight lifting, frequently    ROS:  Review of Systems   Constitutional:  Negative for chills, fatigue and fever.   Respiratory:  Negative for cough, shortness of breath and wheezing.    Cardiovascular:  Negative for chest pain and palpitations.   Gastrointestinal:  Negative for abdominal pain, constipation, diarrhea, nausea and vomiting.   Musculoskeletal:  Negative for arthralgias and back pain.   Skin:  Negative for rash.   Neurological:  Negative for dizziness and headaches.   Psychiatric/Behavioral:  Negative for dysphoric mood. The patient is not nervous/anxious.    All other systems reviewed and are negative.     Current Outpatient Medications on File Prior to Visit   Medication Sig Dispense Refill    prednisoLONE acetate (PRED FORTE) 1 % ophthalmic suspension INSTILL 1 DROP INTO THE RIGHT EYE EVERY 2 HOURS FOR 2 DAYS  THEN 1 DROP INTO THE RIGHT EYE FOUR TIMES A DAY FOR 5 DAYS      Cholecalciferol (VITAMIN D) 50 MCG (2000 UT) CAPS capsule       Multiple Vitamins-Minerals (MULTIVITAMIN ADULT PO) Take by mouth       No current facility-administered medications on file prior to visit.        No Known Allergies    Past Medical History:   Diagnosis Date    Chronic knee pain     COVID-19 03/2019    resolved as of 06-16-19     History of toxoplasmosis     History of varicocele     Infertility male     Osgood-Schlatter's disease      Past Surgical History:   Procedure Laterality Date    EYE SURGERY  01/18/2023    Bilateral  LASIK    KNEE ARTHROSCOPY Right     KNEE ARTHROSCOPY Left 06/22/2019    LEFT KNEE ARTHROSCOPY performed by Debby GORMAN Cull, MD at [facility] OR    NECK SURGERY      TESTICLE SURGERY Left     WISDOM TOOTH EXTRACTION       Family History   Problem Relation Age of Onset    High Blood Pressure Mother     Diabetes Mother     Migraines Mother     High Cholesterol Mother     Other Father         thyroid diorder    Cancer Father         Prostate    Prostate Cancer Father  Elevated Lipids Father     Hypertension Father     Cancer Maternal Grandfather         Deceased    High Cholesterol Maternal Grandmother     Osteoporosis Maternal Grandmother     Coronary Art Dis Paternal Grandmother     Heart Attack Paternal Grandmother     High Cholesterol Paternal Grandmother      Social History     Socioeconomic History    Marital status: Married     Spouse name: Not on file    Number of children: Not on file    Years of education: Not on file    Highest education level: Not on file   Occupational History    Not on file   Tobacco Use    Smoking status: Never    Smokeless tobacco: Never   Substance and Sexual Activity    Alcohol use: Not Currently     Comment: 1 or 2 beers ev other week    Drug use: No    Sexual activity: Yes     Partners: Female     Comment: Wife   Other Topics Concern    Not on file   Social History Narrative    Not on file     Social Determinants of Health     Financial Resource Strain: Low Risk  (01/29/2023)    Overall Financial Resource Strain (CARDIA)     Difficulty of Paying Living Expenses: Not hard at all   Food Insecurity: No Food Insecurity (01/29/2023)    Hunger Vital Sign      Worried About Running Out of Food in the Last Year: Never true     Ran Out of Food in the Last Year: Never true   Transportation Needs: Unknown (01/29/2023)    PRAPARE - Therapist, Art (Medical): Not on file     Lack of Transportation (Non-Medical): No   Physical Activity: Not on file   Stress: Not on file   Social Connections: Not on file   Intimate Partner Violence: Not on file   Housing Stability: Unknown (01/29/2023)    Housing Stability Vital Sign     Unable to Pay for Housing in the Last Year: Not on file     Number of Times Moved in the Last Year: Not on file     Homeless in the Last Year: No       Vitals:    01/29/23 1426   BP: 100/64   Pulse: 76   Temp: 98.3 F (36.8 C)   SpO2: 98%   Weight: 92.1 kg (203 lb)   Height: 1.727 m (5' 8)       Physical Exam:  Physical Exam  Vitals and nursing note reviewed.   Constitutional:       General: He is not in acute distress.     Appearance: Normal appearance. He is well-developed and normal weight. He is not ill-appearing.   HENT:      Head: Normocephalic and atraumatic.      Right Ear: Hearing and external ear normal.      Left Ear: Hearing and external ear normal.      Nose: Nose normal.      Mouth/Throat:      Mouth: Mucous membranes are moist.   Eyes:      General: Lids are normal. No scleral icterus.     Extraocular Movements: Extraocular movements intact.      Conjunctiva/sclera:  Conjunctivae normal.   Neck:      Thyroid: No thyromegaly.      Vascular: No carotid bruit.   Cardiovascular:      Rate and Rhythm: Normal rate and regular rhythm.      Heart sounds: Normal heart sounds. No murmur heard.  Pulmonary:      Effort: Pulmonary effort is normal. No respiratory distress.      Breath sounds: Normal breath sounds. No wheezing.   Musculoskeletal:         General: No tenderness or deformity. Normal range of motion.      Cervical back: Normal range of motion and neck supple.      Right lower leg: No edema.      Left lower leg: No edema.    Lymphadenopathy:      Cervical: No cervical adenopathy.   Skin:     General: Skin is warm and dry.      Findings: No rash.   Neurological:      General: No focal deficit present.      Mental Status: He is alert and oriented to person, place, and time.      Gait: Gait normal.   Psychiatric:         Mood and Affect: Mood and affect normal.         Speech: Speech normal.         Behavior: Behavior normal.         Thought Content: Thought content normal.         Labs:  CBC with Differential:    Lab Results   Component Value Date/Time    WBC 7.8 11/07/2021 02:31 PM    RBC 5.02 12/01/2021 09:07 AM    HGB 15.0 12/01/2021 09:07 AM    HCT 44.5 12/01/2021 09:07 AM    PLT 227 12/01/2021 09:07 AM    MCV 88.7 12/01/2021 09:07 AM    MCH 29.8 12/01/2021 09:07 AM    MCHC 33.6 12/01/2021 09:07 AM    RDW 13.3 12/01/2021 09:07 AM    LYMPHOPCT 26.8 12/01/2021 09:07 AM    MONOPCT 16.1 12/01/2021 09:07 AM    EOSPCT 1.1 12/01/2021 09:07 AM    BASOPCT 0.8 12/01/2021 09:07 AM    MONOSABS 1.0 12/01/2021 09:07 AM    LYMPHSABS 1.6 12/01/2021 09:07 AM    EOSABS 0.1 12/01/2021 09:07 AM    BASOSABS 0.0 12/01/2021 09:07 AM     CMP:    Lab Results   Component Value Date/Time    NA 142 01/05/2022 09:06 AM    K 4.7 01/05/2022 09:06 AM    CL 108 01/05/2022 09:06 AM    CO2 26 01/05/2022 09:06 AM    BUN 25 01/05/2022 09:06 AM    CREATININE 1.0 01/05/2022 09:06 AM    CREATININE 1.0 06/30/2016 12:00 AM    GFRAA >60 09/09/2020 08:29 AM    AGRATIO 1.6 01/05/2022 09:06 AM    LABGLOM 85 01/05/2022 09:06 AM    LABGLOM >60 11/07/2021 02:31 PM    GLUCOSE 89 01/05/2022 09:06 AM    CALCIUM 9.7 01/05/2022 09:06 AM    BILITOT 0.8 01/05/2022 09:06 AM    ALKPHOS 52 01/05/2022 09:06 AM    AST 25 01/05/2022 09:06 AM    ALT 27 01/05/2022 09:06 AM     U/A:    Lab Results   Component Value Date/Time    COLORU Yellow 11/07/2021 02:32 PM    PROTEINU Negative 11/07/2021 02:32 PM  PHUR 7.5 11/07/2021 02:32 PM    CLARITYU Clear 11/07/2021 02:32 PM    LEUKOCYTESUR Negative  11/07/2021 02:32 PM    UROBILINOGEN 0.2 11/07/2021 02:32 PM    BILIRUBINUR Negative 11/07/2021 02:32 PM    BLOODU Negative 11/07/2021 02:32 PM    GLUCOSEU Negative 11/07/2021 02:32 PM     HgBA1c:    Lab Results   Component Value Date/Time    LABA1C 5.5 11/07/2021 02:31 PM     FLP:    Lab Results   Component Value Date/Time    TRIG 58 11/07/2021 02:31 PM    HDL 61 11/07/2021 02:31 PM     TSH:    Lab Results   Component Value Date/Time    TSH 1.170 11/07/2021 02:31 PM        Assessment and Plan:  Assessment & Plan  Encounter for well adult exam without abnormal findings    Healthy 37 yo male.  UTD on HM.  Labs from 2023 reviewed and all well controlled.  Will repeat next year.          Need for influenza vaccination       Orders:    Influenza, FLUCELVAX Trivalent, (age 65 mo+) IM, Preservative Free, 0.5mL           Return in about 1 year (around 01/29/2024), or if symptoms worsen or fail to improve, for Physical.      Seen By:  Mariel Bame, DO

## 2023-06-27 ENCOUNTER — Ambulatory Visit: Admit: 2023-06-27 | Discharge: 2023-07-02 | Payer: PRIVATE HEALTH INSURANCE | Primary: Family Medicine

## 2023-06-27 ENCOUNTER — Ambulatory Visit
Admit: 2023-06-27 | Discharge: 2023-06-27 | Payer: PRIVATE HEALTH INSURANCE | Attending: Family Medicine | Primary: Family Medicine

## 2023-06-27 VITALS — BP 129/81 | HR 69 | Temp 98.10000°F | Resp 18 | Ht 68.0 in | Wt 203.0 lb

## 2023-06-27 DIAGNOSIS — G8929 Other chronic pain: Secondary | ICD-10-CM

## 2023-06-27 MED ORDER — TRIAMCINOLONE ACETONIDE 40 MG/ML IJ SUSP
40 | Freq: Once | INTRAMUSCULAR | Status: AC
Start: 2023-06-27 — End: 2023-06-27
  Administered 2023-06-27: 21:00:00 40 mg via INTRA_ARTICULAR

## 2023-06-27 MED ORDER — LIDOCAINE HCL 1 % IJ SOLN
1 | Freq: Once | INTRAMUSCULAR | Status: AC
Start: 2023-06-27 — End: 2023-06-27
  Administered 2023-06-27: 21:00:00 5 mL via INTRA_ARTICULAR

## 2023-06-27 NOTE — Progress Notes (Signed)
 [facility]  PRIMARY CARE SPORTS MEDICINE  DATE OF VISIT : 06/27/2023    Patient : Kyle Noble  Age : 38 y.o.  DOB : 1986/04/20  MRN : [id number]   ______________________________________________________________________    Chief Complaint :   Chief Complaint   Patient presents with    Knee Pain     Left knee  had   scope with Boniface up and down stairs   slowing him down        HPI : Kyle Noble is 38 y.o. male who presented to the clinic today for evaluation of left knee pain.  This is a chronic problem with a history of a previous arthroscopic knee surgery with Dr. Loman Chroman a few years ago.  Patient reports recent exacerbation of symptoms with no known mechanism of injury.  Current symptoms include medial joint line pain and swelling.  Patient denies mechanical symptoms.  Pain is aggravated by any weight bearing activity. Evaluation to date: XRs of the left knee which demonstrate no acute fractures or dislocations.  Treatment to date: avoidance of offending activity and OTC analgesics which are not very effective.     Past Medical History :  Past Medical History:   Diagnosis Date    Chronic knee pain     COVID-19 03/2019    resolved as of 06-16-19     History of toxoplasmosis     History of varicocele     Infertility male     Osgood-Schlatter's disease      Past Surgical History:   Procedure Laterality Date    EYE SURGERY  01/18/2023    Bilateral  LASIK    KNEE ARTHROSCOPY Right     KNEE ARTHROSCOPY Left 06/22/2019    LEFT KNEE ARTHROSCOPY performed by Coralee Rud, MD at [facility] OR    NECK SURGERY      TESTICLE SURGERY Left     WISDOM TOOTH EXTRACTION         Allergies :   No Known Allergies    Medication List :    Current Outpatient Medications   Medication Sig Dispense Refill    prednisoLONE acetate (PRED FORTE) 1 % ophthalmic suspension INSTILL 1 DROP INTO THE RIGHT EYE EVERY 2 HOURS FOR 2 DAYS  THEN 1 DROP INTO THE RIGHT EYE FOUR TIMES A DAY FOR 5 DAYS      Cholecalciferol (VITAMIN D) 50 MCG (2000 UT)  CAPS capsule       Multiple Vitamins-Minerals (MULTIVITAMIN ADULT PO) Take by mouth       No current facility-administered medications for this visit.      ______________________________________________________________________    Physical Exam :    Vitals: BP 129/81 (Site: Right Upper Arm, Position: Sitting, Cuff Size: Large Adult)   Pulse 69   Temp 98.1 F (36.7 C) (Infrared)   Resp 18   Ht 1.727 m (5\' 8" )   Wt 92.1 kg (203 lb)   SpO2 98%   BMI 30.87 kg/m   General Appearance: Nontoxic, awake, alert, and in no acute distress.  Chest wall/Lung: Respirations regular and unlabored. No cyanosis.  Heart: RRR, distal pulses intact.  Neurologic: Alert&Oriented x3. Sensation grossly intact. No focal motor deficits detected.  Musculoskeletal: LEFT Knee:  ROM: flexion to 140, extension to 0.  Mild effusion.  No obvious bony abnormality or ecchymosis.  TTP: ( + ) MJL, ( - ) LJL, ( - ) patellar tendon, ( - ) quadriceps tendon, ( + ) patellar facets  Special Tests: ( - ) Patellar apprehension, ( - ) patellar grind  Stable and without pain to varus and valgus stress at 0 and 30 degrees of knee flexion.  ACL: ( - ) Lachman's, ( - ) anterior drawer  PCL: ( - ) posterior drawer, ( - ) sag sign  Meniscus: ( + ) McMurray's  ______________________________________________________________________    Assessment & Plan :    1. Chronic pain of left knee  2. Primary osteoarthritis of left knee  Patient presents to the office today for evaluation of left knee pain.  History, referring provider note, physical exam and imaging (as interpreted by me) are consistent with mild degenerative change of the left knee with most significant changes in the patellofemoral compartment.  Treatment options discussed with patient in the office today including activity modification, oral anti-inflammatories, physical therapy, injection options, advanced imaging the form of a MRI and referral to an orthopedic surgeon for discussion of surgical  opinion. Patient wishes to proceed with conservative treatment in the form of an intra-articular corticosteroid injection which was performed in the office today, please see procedure note below for further details.  Patient will follow up in 6 weeks for reevaluation of symptoms and consider escalation therapy should symptoms persist.  Patient is agreeable with above plan all questions and concerns were addressed in the office today.     - XR KNEE LEFT (MIN 4 VIEWS); Future  - lidocaine 1 % injection 5 mL  - triamcinolone acetonide (KENALOG-40) injection 40 mg    PROCEDURE NOTE: LEFT knee intra-articular corticosteroid injection  The procedure and its risks, benefits and alternatives were discussed with the patient, and informed consent was obtained.  The area was prepped and cleaned with Alcohol.  Ethyl Chloride was used for local anesthesia.  A 25G 1.5" needle was inserted into the joint and 3.0 mL of 1% Lidocaine without Epinephrine mixed with 1mL of Kenalog 40mg /28mL was injected into the joint without difficulty.  A band aid was placed over the injection site.  There was no blood loss. The patient tolerated the procedure well.  Discussed signs and symptoms of infection and advised to call right away if this happens.  Patient verbalizes understanding and agrees with above assessment, plan, procedure and counseling.     Return to Office: Return in about 6 weeks (around 08/08/2023) for injection follow up.    Claris Pong, MD

## 2024-02-18 MED ORDER — RIZATRIPTAN BENZOATE 10 MG PO TABS
10 | ORAL_TABLET | Freq: Once | ORAL | 5 refills | Status: AC | PRN
Start: 2024-02-18 — End: ?
